# Patient Record
Sex: Male | Born: 1980 | Race: White | Hispanic: No | Smoking: Former smoker
Health system: Southern US, Community
[De-identification: ages and names within clinical notes are randomized; demographics above are authoritative.]

---

## 2008-06-13 ENCOUNTER — Encounter (INDEPENDENT_AMBULATORY_CARE_PROVIDER_SITE_OTHER): Payer: Self-pay | Admitting: *Deleted

## 2015-11-22 ENCOUNTER — Ambulatory Visit: Payer: BLUE CROSS/BLUE SHIELD | Admitting: Podiatry

## 2015-12-04 ENCOUNTER — Ambulatory Visit: Payer: BLUE CROSS/BLUE SHIELD | Admitting: Podiatry

## 2016-04-17 DIAGNOSIS — F988 Other specified behavioral and emotional disorders with onset usually occurring in childhood and adolescence: Secondary | ICD-10-CM | POA: Insufficient documentation

## 2016-04-17 DIAGNOSIS — K219 Gastro-esophageal reflux disease without esophagitis: Secondary | ICD-10-CM | POA: Insufficient documentation

## 2016-04-17 DIAGNOSIS — F419 Anxiety disorder, unspecified: Secondary | ICD-10-CM | POA: Insufficient documentation

## 2016-09-20 ENCOUNTER — Inpatient Hospital Stay (HOSPITAL_COMMUNITY)
Admission: EM | Admit: 2016-09-20 | Discharge: 2016-09-23 | DRG: 494 | Disposition: A | Discharge status: Home / Self Care | Payer: BLUE CROSS/BLUE SHIELD | Attending: Orthopaedic Surgery | Admitting: Orthopaedic Surgery

## 2016-09-20 ENCOUNTER — Encounter (HOSPITAL_COMMUNITY): Payer: Self-pay | Admitting: Emergency Medicine

## 2016-09-20 ENCOUNTER — Emergency Department (HOSPITAL_COMMUNITY): Payer: BLUE CROSS/BLUE SHIELD

## 2016-09-20 DIAGNOSIS — Z79899 Other long term (current) drug therapy: Secondary | ICD-10-CM

## 2016-09-20 DIAGNOSIS — Z419 Encounter for procedure for purposes other than remedying health state, unspecified: Secondary | ICD-10-CM

## 2016-09-20 DIAGNOSIS — S82231A Displaced oblique fracture of shaft of right tibia, initial encounter for closed fracture: Secondary | ICD-10-CM | POA: Diagnosis not present

## 2016-09-20 DIAGNOSIS — S82301A Unspecified fracture of lower end of right tibia, initial encounter for closed fracture: Secondary | ICD-10-CM

## 2016-09-20 DIAGNOSIS — M25571 Pain in right ankle and joints of right foot: Secondary | ICD-10-CM | POA: Diagnosis not present

## 2016-09-20 DIAGNOSIS — W000XXA Fall on same level due to ice and snow, initial encounter: Secondary | ICD-10-CM | POA: Diagnosis present

## 2016-09-20 DIAGNOSIS — S82209A Unspecified fracture of shaft of unspecified tibia, initial encounter for closed fracture: Secondary | ICD-10-CM | POA: Diagnosis present

## 2016-09-20 DIAGNOSIS — S82831A Other fracture of upper and lower end of right fibula, initial encounter for closed fracture: Secondary | ICD-10-CM | POA: Diagnosis present

## 2016-09-20 DIAGNOSIS — Z9889 Other specified postprocedural states: Secondary | ICD-10-CM

## 2016-09-20 DIAGNOSIS — Y92009 Unspecified place in unspecified non-institutional (private) residence as the place of occurrence of the external cause: Secondary | ICD-10-CM

## 2016-09-20 DIAGNOSIS — Z87891 Personal history of nicotine dependence: Secondary | ICD-10-CM

## 2016-09-20 DIAGNOSIS — S82391A Other fracture of lower end of right tibia, initial encounter for closed fracture: Secondary | ICD-10-CM

## 2016-09-20 DIAGNOSIS — Z7982 Long term (current) use of aspirin: Secondary | ICD-10-CM

## 2016-09-20 NOTE — ED Notes (Signed)
Bed: WA04 Expected date:  Expected time:  Means of arrival:  Comments: Fall, right ankle injury

## 2016-09-20 NOTE — ED Notes (Signed)
Patient transported to X-ray 

## 2016-09-20 NOTE — ED Triage Notes (Signed)
Per EMS pt from home slipped on ice and landed on right ankle. 10-12 beers on board. 100 m fentanly given en route.

## 2016-09-20 NOTE — ED Provider Notes (Signed)
[facility]-EMERGENCY DEPT Provider Note   CSN: [id number] Arrival date & time: 09/20/16  2214     History   Chief Complaint Chief Complaint  Patient presents with  . Ankle Injury    right    HPI Daniel Potter is a 36 y.o. male.  Pt presents to the ED today with right ankle pain.  He has been drinking alcohol and slipped on some ice.  EMS gave him 100 mcg fentanyl en route.      History reviewed. No pertinent past medical history.  Patient Active Problem List   Diagnosis Date Noted  . Closed tibia fracture 09/21/2016    History reviewed. No pertinent surgical history.     Home Medications    Prior to Admission medications   Not on File    Family History History reviewed. No pertinent family history.  Social History Social History  Substance Use Topics  . Smoking status: Former Smoker    Types: Cigarettes  . Smokeless tobacco: Never Used  . Alcohol use 7.2 oz/week    12 Cans of beer per week     Allergies   Patient has no known allergies.   Review of Systems Review of Systems  Musculoskeletal:       Right ankle  All other systems reviewed and are negative.    Physical Exam Updated Vital Signs BP 130/78   Pulse 100   Temp 98.6 F (37 C) (Oral)   Resp 24   Ht 6\' 5"  (1.956 m)   Wt 285 lb (129.3 kg)   SpO2 98%   BMI 33.80 kg/m   Physical Exam  Constitutional: He is oriented to person, place, and time. He appears well-developed and well-nourished.  HENT:  Head: Normocephalic and atraumatic.  Right Ear: External ear normal.  Left Ear: External ear normal.  Nose: Nose normal.  Mouth/Throat: Oropharynx is clear and moist.  Eyes: Conjunctivae and EOM are normal. Pupils are equal, round, and reactive to light.  Neck: Normal range of motion. Neck supple.  Cardiovascular: Normal rate, regular rhythm, normal heart sounds and intact distal pulses.   Pulmonary/Chest: Effort normal and breath sounds normal.  Abdominal: Soft. Bowel sounds are  normal.  Musculoskeletal:       Right ankle: He exhibits deformity. Tenderness. Achilles tendon exhibits no pain.  Neurological: He is alert and oriented to person, place, and time.  Skin: Skin is warm.  Psychiatric: He has a normal mood and affect. His behavior is normal. Judgment and thought content normal.  Nursing note and vitals reviewed.    ED Treatments / Results  Labs (all labs ordered are listed, but only abnormal results are displayed) Labs Reviewed - No data to display  EKG  EKG Interpretation None       Radiology Dg Tibia/fibula Right  Result Date: 09/20/2016 CLINICAL DATA:  Slipped on ice, pain EXAM: RIGHT TIBIA AND FIBULA - 2 VIEW COMPARISON:  None. FINDINGS: There is a proximal fibular fracture which demonstrates 1/2 bone with of posterior and lateral displacement of distal fracture fragment and approximately 6 mm of separation. No significant angulation. Additional fracture of the distal shaft of the tibia, demonstrating close to 1/2 shaft diameter of lateral displacement. IMPRESSION: 1. Displaced proximal fibular fracture 2. Displaced distal tibial shaft fracture Electronically Signed   By: Jasmine Pang M.D.   On: 09/20/2016 23:14   Dg Ankle Complete Right  Result Date: 09/20/2016 CLINICAL DATA:  Slipped on ice, pain to the tib-fib and ankle EXAM: RIGHT ANKLE -  COMPLETE 3+ VIEW COMPARISON:  None FINDINGS: Small plantar calcaneal spur. Ankle mortise is symmetric. Partially visualized fracture of the distal diaphysis of the tibia, this demonstrates 1/3 shaft diameter of lateral displacement of distal fracture fragment. IMPRESSION: Partially visualized displaced fracture of the distal shaft of the tibia. Electronically Signed   By: Jasmine PangKim  Fujinaga M.D.   On: 09/20/2016 23:13    Procedures Procedures (including critical care time)  Medications Ordered in ED Medications  morphine 4 MG/ML injection 4 mg (not administered)  ondansetron (ZOFRAN) injection 4 mg (not  administered)  famotidine (PEPCID) IVPB 20 mg premix (not administered)  LORazepam (ATIVAN) injection 1 mg (not administered)     Initial Impression / Assessment and Plan / ED Course  I have reviewed the triage vital signs and the nursing notes.  Pertinent labs & imaging results that were available during my care of the patient were reviewed by me and considered in my medical decision making (see chart for details).    Pt d/w Dr. Roda ShuttersXu [facility]    Final Clinical Impressions(s) / ED Diagnoses   Final diagnoses:  Other closed fracture of distal end of right tibia, initial encounter  Other closed fracture of proximal end of right fibula, initial encounter    New Prescriptions New Prescriptions   No medications on file     Jacalyn LefevreJulie Oland Arquette, MD 09/21/16 [phone]

## 2016-09-21 ENCOUNTER — Inpatient Hospital Stay (HOSPITAL_COMMUNITY): Payer: BLUE CROSS/BLUE SHIELD

## 2016-09-21 ENCOUNTER — Encounter (HOSPITAL_COMMUNITY)
Admission: EM | Disposition: A | Discharge status: Home / Self Care | Payer: Self-pay | Source: Home / Self Care | Attending: Orthopaedic Surgery

## 2016-09-21 ENCOUNTER — Inpatient Hospital Stay (HOSPITAL_COMMUNITY): Payer: BLUE CROSS/BLUE SHIELD | Admitting: Certified Registered"

## 2016-09-21 DIAGNOSIS — M25571 Pain in right ankle and joints of right foot: Secondary | ICD-10-CM | POA: Diagnosis present

## 2016-09-21 DIAGNOSIS — S82831A Other fracture of upper and lower end of right fibula, initial encounter for closed fracture: Secondary | ICD-10-CM | POA: Diagnosis present

## 2016-09-21 DIAGNOSIS — S82391A Other fracture of lower end of right tibia, initial encounter for closed fracture: Secondary | ICD-10-CM | POA: Diagnosis not present

## 2016-09-21 DIAGNOSIS — W000XXA Fall on same level due to ice and snow, initial encounter: Secondary | ICD-10-CM | POA: Diagnosis present

## 2016-09-21 DIAGNOSIS — Z79899 Other long term (current) drug therapy: Secondary | ICD-10-CM | POA: Diagnosis not present

## 2016-09-21 DIAGNOSIS — Z7982 Long term (current) use of aspirin: Secondary | ICD-10-CM | POA: Diagnosis not present

## 2016-09-21 DIAGNOSIS — S82231A Displaced oblique fracture of shaft of right tibia, initial encounter for closed fracture: Secondary | ICD-10-CM | POA: Diagnosis not present

## 2016-09-21 DIAGNOSIS — S82209A Unspecified fracture of shaft of unspecified tibia, initial encounter for closed fracture: Secondary | ICD-10-CM | POA: Diagnosis present

## 2016-09-21 DIAGNOSIS — Y92009 Unspecified place in unspecified non-institutional (private) residence as the place of occurrence of the external cause: Secondary | ICD-10-CM | POA: Diagnosis not present

## 2016-09-21 DIAGNOSIS — S82301A Unspecified fracture of lower end of right tibia, initial encounter for closed fracture: Secondary | ICD-10-CM

## 2016-09-21 DIAGNOSIS — Z87891 Personal history of nicotine dependence: Secondary | ICD-10-CM | POA: Diagnosis not present

## 2016-09-21 HISTORY — PX: TIBIA IM NAIL INSERTION: SHX2516

## 2016-09-21 SURGERY — INSERTION, INTRAMEDULLARY ROD, TIBIA
Anesthesia: General | Site: Leg Lower | Laterality: Right

## 2016-09-21 MED ORDER — SUGAMMADEX SODIUM 500 MG/5ML IV SOLN
INTRAVENOUS | Status: AC
  Filled 2016-09-21: qty 5

## 2016-09-21 MED ORDER — PROPOFOL 10 MG/ML IV BOLUS
INTRAVENOUS | Status: AC
  Filled 2016-09-21: qty 20

## 2016-09-21 MED ORDER — MORPHINE SULFATE (PF) 2 MG/ML IV SOLN
2.0000 mg | INTRAVENOUS | Status: DC | PRN
  Administered 2016-09-21: 2 mg via INTRAVENOUS
  Filled 2016-09-21: qty 1

## 2016-09-21 MED ORDER — ONDANSETRON HCL 4 MG/2ML IJ SOLN
INTRAMUSCULAR | Status: AC
  Filled 2016-09-21: qty 2

## 2016-09-21 MED ORDER — ONDANSETRON HCL 4 MG PO TABS: 4.0000 mg | ORAL_TABLET | Freq: Three times a day (TID) | ORAL | 0 refills | Status: DC | PRN

## 2016-09-21 MED ORDER — PROPOFOL 10 MG/ML IV BOLUS
INTRAVENOUS | Status: DC | PRN
  Administered 2016-09-21: 130 mg via INTRAVENOUS

## 2016-09-21 MED ORDER — SODIUM CHLORIDE 0.9 % IV SOLN: INTRAVENOUS | Status: DC

## 2016-09-21 MED ORDER — FENTANYL CITRATE (PF) 100 MCG/2ML IJ SOLN
INTRAMUSCULAR | Status: DC | PRN
  Administered 2016-09-21 (×7): 50 ug via INTRAVENOUS

## 2016-09-21 MED ORDER — POLYETHYLENE GLYCOL 3350 17 G PO PACK: 17.0000 g | PACK | Freq: Every day | ORAL | Status: DC | PRN

## 2016-09-21 MED ORDER — HYDROMORPHONE HCL 1 MG/ML IJ SOLN
1.0000 mg | INTRAMUSCULAR | Status: DC | PRN
  Filled 2016-09-21: qty 1

## 2016-09-21 MED ORDER — DEXTROSE 5 % IV SOLN
500.0000 mg | Freq: Four times a day (QID) | INTRAVENOUS | Status: DC | PRN
  Filled 2016-09-21: qty 5

## 2016-09-21 MED ORDER — METHOCARBAMOL 500 MG PO TABS: 500.0000 mg | ORAL_TABLET | Freq: Four times a day (QID) | ORAL | Status: DC | PRN

## 2016-09-21 MED ORDER — OXYCODONE HCL 5 MG PO TABS: 5.0000 mg | ORAL_TABLET | ORAL | 0 refills | Status: DC | PRN

## 2016-09-21 MED ORDER — SUCCINYLCHOLINE CHLORIDE 20 MG/ML IJ SOLN
INTRAMUSCULAR | Status: DC | PRN
  Administered 2016-09-21: 160 mg via INTRAVENOUS

## 2016-09-21 MED ORDER — CEFAZOLIN SODIUM 1 G IJ SOLR
INTRAMUSCULAR | Status: DC | PRN
  Administered 2016-09-21: 3 g via INTRAMUSCULAR

## 2016-09-21 MED ORDER — SUCCINYLCHOLINE CHLORIDE 200 MG/10ML IV SOSY
PREFILLED_SYRINGE | INTRAVENOUS | Status: AC
  Filled 2016-09-21: qty 10

## 2016-09-21 MED ORDER — ARTIFICIAL TEARS OP OINT
TOPICAL_OINTMENT | OPHTHALMIC | Status: AC
  Filled 2016-09-21: qty 3.5

## 2016-09-21 MED ORDER — LORAZEPAM 2 MG/ML IJ SOLN: 0.5000 mg | Freq: Once | INTRAMUSCULAR | Status: DC

## 2016-09-21 MED ORDER — HYDROMORPHONE HCL 1 MG/ML IJ SOLN
INTRAMUSCULAR | Status: AC
  Filled 2016-09-21: qty 0.5

## 2016-09-21 MED ORDER — DEXMEDETOMIDINE HCL 200 MCG/2ML IV SOLN
INTRAVENOUS | Status: DC | PRN
  Administered 2016-09-21 (×3): 16 ug via INTRAVENOUS
  Administered 2016-09-21: 8 ug via INTRAVENOUS
  Administered 2016-09-21: 16 ug via INTRAVENOUS
  Administered 2016-09-21: 8 ug via INTRAVENOUS

## 2016-09-21 MED ORDER — LIDOCAINE 2% (20 MG/ML) 5 ML SYRINGE
INTRAMUSCULAR | Status: AC
  Filled 2016-09-21: qty 5

## 2016-09-21 MED ORDER — SUGAMMADEX SODIUM 200 MG/2ML IV SOLN
INTRAVENOUS | Status: DC | PRN
  Administered 2016-09-21: 260 mg via INTRAVENOUS

## 2016-09-21 MED ORDER — ASPIRIN EC 325 MG PO TBEC
325.0000 mg | DELAYED_RELEASE_TABLET | Freq: Two times a day (BID) | ORAL | Status: DC
  Administered 2016-09-21 – 2016-09-23 (×4): 325 mg via ORAL
  Filled 2016-09-21 (×4): qty 1

## 2016-09-21 MED ORDER — LORAZEPAM 2 MG/ML IJ SOLN
0.5000 mg | INTRAMUSCULAR | Status: DC | PRN
  Administered 2016-09-23: 0.5 mg via INTRAVENOUS
  Filled 2016-09-21: qty 1

## 2016-09-21 MED ORDER — ONDANSETRON HCL 4 MG/2ML IJ SOLN
4.0000 mg | Freq: Once | INTRAMUSCULAR | Status: AC
  Administered 2016-09-21: 4 mg via INTRAVENOUS
  Filled 2016-09-21: qty 2

## 2016-09-21 MED ORDER — CEFAZOLIN SODIUM-DEXTROSE 2-4 GM/100ML-% IV SOLN
2.0000 g | Freq: Four times a day (QID) | INTRAVENOUS | Status: AC
  Administered 2016-09-21 – 2016-09-22 (×3): 2 g via INTRAVENOUS
  Filled 2016-09-21 (×3): qty 100

## 2016-09-21 MED ORDER — MIDAZOLAM HCL 2 MG/2ML IJ SOLN
INTRAMUSCULAR | Status: AC
  Filled 2016-09-21: qty 2

## 2016-09-21 MED ORDER — ONDANSETRON HCL 4 MG/2ML IJ SOLN: 4.0000 mg | Freq: Once | INTRAMUSCULAR | Status: DC | PRN

## 2016-09-21 MED ORDER — VARENICLINE TARTRATE 1 MG PO TABS
1.0000 mg | ORAL_TABLET | Freq: Two times a day (BID) | ORAL | Status: DC
  Administered 2016-09-21: 1 mg via ORAL
  Filled 2016-09-21 (×6): qty 1

## 2016-09-21 MED ORDER — OXYCODONE HCL 5 MG PO TABS
5.0000 mg | ORAL_TABLET | ORAL | Status: DC | PRN
  Administered 2016-09-21 – 2016-09-23 (×7): 15 mg via ORAL
  Filled 2016-09-21 (×7): qty 3

## 2016-09-21 MED ORDER — OXYCODONE HCL 5 MG PO TABS
5.0000 mg | ORAL_TABLET | ORAL | Status: DC | PRN
  Administered 2016-09-21: 10 mg via ORAL
  Filled 2016-09-21: qty 2

## 2016-09-21 MED ORDER — SODIUM CHLORIDE 0.9 % IV SOLN
INTRAVENOUS | Status: AC
  Administered 2016-09-21: 125 mL/h via INTRAVENOUS

## 2016-09-21 MED ORDER — ONDANSETRON HCL 4 MG/2ML IJ SOLN
4.0000 mg | Freq: Three times a day (TID) | INTRAMUSCULAR | Status: AC | PRN
  Filled 2016-09-21: qty 2

## 2016-09-21 MED ORDER — SORBITOL 70 % SOLN: 30.0000 mL | Freq: Every day | Status: DC | PRN

## 2016-09-21 MED ORDER — CITALOPRAM HYDROBROMIDE 40 MG PO TABS
40.0000 mg | ORAL_TABLET | Freq: Every day | ORAL | Status: DC
  Administered 2016-09-21 – 2016-09-23 (×3): 40 mg via ORAL
  Filled 2016-09-21 (×3): qty 1

## 2016-09-21 MED ORDER — ALBUTEROL SULFATE HFA 108 (90 BASE) MCG/ACT IN AERS
INHALATION_SPRAY | RESPIRATORY_TRACT | Status: DC | PRN
  Administered 2016-09-21 (×3): 2 via RESPIRATORY_TRACT

## 2016-09-21 MED ORDER — MIDAZOLAM HCL 5 MG/5ML IJ SOLN
INTRAMUSCULAR | Status: DC | PRN
  Administered 2016-09-21: 2 mg via INTRAVENOUS

## 2016-09-21 MED ORDER — OXYCODONE HCL ER 10 MG PO T12A: 10.0000 mg | EXTENDED_RELEASE_TABLET | Freq: Two times a day (BID) | ORAL | 0 refills | Status: DC

## 2016-09-21 MED ORDER — LORAZEPAM 2 MG/ML IJ SOLN
0.5000 mg | Freq: Once | INTRAMUSCULAR | Status: DC
  Filled 2016-09-21: qty 1

## 2016-09-21 MED ORDER — METOCLOPRAMIDE HCL 5 MG/ML IJ SOLN: 5.0000 mg | Freq: Three times a day (TID) | INTRAMUSCULAR | Status: DC | PRN

## 2016-09-21 MED ORDER — HYDROMORPHONE HCL 2 MG/ML IJ SOLN
1.0000 mg | INTRAMUSCULAR | Status: DC | PRN
  Administered 2016-09-21 – 2016-09-23 (×8): 1 mg via INTRAVENOUS
  Filled 2016-09-21 (×8): qty 1

## 2016-09-21 MED ORDER — ONDANSETRON HCL 4 MG PO TABS: 4.0000 mg | ORAL_TABLET | Freq: Four times a day (QID) | ORAL | Status: DC | PRN

## 2016-09-21 MED ORDER — ASPIRIN EC 325 MG PO TBEC
325.0000 mg | DELAYED_RELEASE_TABLET | Freq: Two times a day (BID) | ORAL | 0 refills | Status: DC
Start: 2016-09-21 — End: 2020-01-31

## 2016-09-21 MED ORDER — LORAZEPAM 2 MG/ML IJ SOLN
1.0000 mg | Freq: Once | INTRAMUSCULAR | Status: AC
  Administered 2016-09-21: 1 mg via INTRAVENOUS
  Filled 2016-09-21: qty 1

## 2016-09-21 MED ORDER — KETOROLAC TROMETHAMINE 30 MG/ML IJ SOLN
30.0000 mg | Freq: Four times a day (QID) | INTRAMUSCULAR | Status: DC
  Administered 2016-09-21 – 2016-09-23 (×7): 30 mg via INTRAVENOUS
  Filled 2016-09-21 (×8): qty 1

## 2016-09-21 MED ORDER — SENNOSIDES-DOCUSATE SODIUM 8.6-50 MG PO TABS: 1.0000 | ORAL_TABLET | Freq: Every evening | ORAL | 1 refills | Status: DC | PRN

## 2016-09-21 MED ORDER — MORPHINE SULFATE (PF) 4 MG/ML IV SOLN
4.0000 mg | Freq: Once | INTRAVENOUS | Status: AC
  Administered 2016-09-21: 4 mg via INTRAVENOUS
  Filled 2016-09-21: qty 1

## 2016-09-21 MED ORDER — MEPERIDINE HCL 25 MG/ML IJ SOLN: 6.2500 mg | INTRAMUSCULAR | Status: DC | PRN

## 2016-09-21 MED ORDER — HYDROMORPHONE HCL 1 MG/ML IJ SOLN
0.2500 mg | INTRAMUSCULAR | Status: DC | PRN
  Administered 2016-09-21: 0.5 mg via INTRAVENOUS

## 2016-09-21 MED ORDER — OXYCODONE HCL ER 10 MG PO T12A
10.0000 mg | EXTENDED_RELEASE_TABLET | Freq: Two times a day (BID) | ORAL | Status: DC
  Administered 2016-09-21 – 2016-09-23 (×4): 10 mg via ORAL
  Filled 2016-09-21 (×4): qty 1

## 2016-09-21 MED ORDER — MAGNESIUM CITRATE PO SOLN: 1.0000 | Freq: Once | ORAL | Status: DC | PRN

## 2016-09-21 MED ORDER — 0.9 % SODIUM CHLORIDE (POUR BTL) OPTIME
TOPICAL | Status: DC | PRN
  Administered 2016-09-21: 1000 mL

## 2016-09-21 MED ORDER — ACETAMINOPHEN 650 MG RE SUPP: 650.0000 mg | Freq: Four times a day (QID) | RECTAL | Status: DC | PRN

## 2016-09-21 MED ORDER — CALCIUM CARBONATE ANTACID 500 MG PO CHEW
1.0000 | CHEWABLE_TABLET | Freq: Two times a day (BID) | ORAL | Status: DC | PRN
  Administered 2016-09-22: 200 mg via ORAL
  Filled 2016-09-21: qty 1

## 2016-09-21 MED ORDER — FENTANYL CITRATE (PF) 100 MCG/2ML IJ SOLN
INTRAMUSCULAR | Status: AC
  Filled 2016-09-21: qty 2

## 2016-09-21 MED ORDER — ONDANSETRON HCL 4 MG/2ML IJ SOLN: 4.0000 mg | Freq: Four times a day (QID) | INTRAMUSCULAR | Status: DC | PRN

## 2016-09-21 MED ORDER — METOCLOPRAMIDE HCL 5 MG PO TABS
5.0000 mg | ORAL_TABLET | Freq: Three times a day (TID) | ORAL | Status: DC | PRN
  Administered 2016-09-23: 5 mg via ORAL
  Filled 2016-09-21: qty 1

## 2016-09-21 MED ORDER — FENTANYL CITRATE (PF) 100 MCG/2ML IJ SOLN
INTRAMUSCULAR | Status: AC
  Filled 2016-09-21: qty 4

## 2016-09-21 MED ORDER — ROCURONIUM BROMIDE 50 MG/5ML IV SOSY
PREFILLED_SYRINGE | INTRAVENOUS | Status: AC
  Filled 2016-09-21: qty 5

## 2016-09-21 MED ORDER — METHOCARBAMOL 750 MG PO TABS: 750.0000 mg | ORAL_TABLET | Freq: Two times a day (BID) | ORAL | 0 refills | Status: DC | PRN

## 2016-09-21 MED ORDER — ACETAMINOPHEN 325 MG PO TABS: 650.0000 mg | ORAL_TABLET | Freq: Four times a day (QID) | ORAL | Status: DC | PRN

## 2016-09-21 MED ORDER — METHOCARBAMOL 500 MG PO TABS
500.0000 mg | ORAL_TABLET | Freq: Four times a day (QID) | ORAL | Status: DC | PRN
  Administered 2016-09-21 – 2016-09-23 (×3): 500 mg via ORAL
  Filled 2016-09-21 (×3): qty 1

## 2016-09-21 MED ORDER — MORPHINE SULFATE (PF) 2 MG/ML IV SOLN: 1.0000 mg | INTRAVENOUS | Status: DC | PRN

## 2016-09-21 MED ORDER — METHOCARBAMOL 1000 MG/10ML IJ SOLN
500.0000 mg | Freq: Four times a day (QID) | INTRAVENOUS | Status: DC | PRN
  Filled 2016-09-21: qty 5

## 2016-09-21 MED ORDER — ROCURONIUM BROMIDE 100 MG/10ML IV SOLN
INTRAVENOUS | Status: DC | PRN
  Administered 2016-09-21: 50 mg via INTRAVENOUS

## 2016-09-21 MED ORDER — FAMOTIDINE IN NACL 20-0.9 MG/50ML-% IV SOLN
20.0000 mg | Freq: Once | INTRAVENOUS | Status: AC
  Administered 2016-09-21: 20 mg via INTRAVENOUS
  Filled 2016-09-21: qty 50

## 2016-09-21 MED ORDER — LACTATED RINGERS IV SOLN
INTRAVENOUS | Status: DC
  Administered 2016-09-21 (×2): via INTRAVENOUS

## 2016-09-21 MED ORDER — PROMETHAZINE HCL 25 MG PO TABS: 25.0000 mg | ORAL_TABLET | Freq: Four times a day (QID) | ORAL | 1 refills | Status: DC | PRN

## 2016-09-21 MED ORDER — LIDOCAINE HCL (CARDIAC) 20 MG/ML IV SOLN
INTRAVENOUS | Status: DC | PRN
  Administered 2016-09-21: 100 mg via INTRAVENOUS

## 2016-09-21 MED ORDER — DIPHENHYDRAMINE HCL 12.5 MG/5ML PO ELIX
25.0000 mg | ORAL_SOLUTION | ORAL | Status: DC | PRN
  Administered 2016-09-22 – 2016-09-23 (×6): 25 mg via ORAL
  Filled 2016-09-21 (×6): qty 10

## 2016-09-21 SURGICAL SUPPLY — 61 items
BANDAGE ACE 3X5.8 VEL STRL LF (GAUZE/BANDAGES/DRESSINGS) ×3 IMPLANT
BANDAGE ACE 4X5 VEL STRL LF (GAUZE/BANDAGES/DRESSINGS) ×3 IMPLANT
BANDAGE ACE 6X5 VEL STRL LF (GAUZE/BANDAGES/DRESSINGS) ×3 IMPLANT
BANDAGE ESMARK 6X9 LF (GAUZE/BANDAGES/DRESSINGS) ×1 IMPLANT
BIT DRILL AO GAMMA 4.2X130 (BIT) ×6 IMPLANT
BIT DRILL AO GAMMA 4.2X180 (BIT) ×6 IMPLANT
BIT DRILL AO GAMMA 4.2X340 (BIT) ×3 IMPLANT
BNDG ESMARK 6X9 LF (GAUZE/BANDAGES/DRESSINGS) ×3
COVER MAYO STAND STRL (DRAPES) ×3 IMPLANT
COVER SURGICAL LIGHT HANDLE (MISCELLANEOUS) ×3 IMPLANT
CUFF TOURNIQUET SINGLE 34IN LL (TOURNIQUET CUFF) ×3 IMPLANT
DRAPE C-ARM 42X72 X-RAY (DRAPES) ×3 IMPLANT
DRAPE C-ARMOR (DRAPES) ×3 IMPLANT
DRAPE HALF SHEET 40X57 (DRAPES) ×6 IMPLANT
DRAPE IMP U-DRAPE 54X76 (DRAPES) ×3 IMPLANT
DRAPE POUCH INSTRU U-SHP 10X18 (DRAPES) ×3 IMPLANT
DRAPE U-SHAPE 47X51 STRL (DRAPES) ×3 IMPLANT
DRAPE UTILITY XL STRL (DRAPES) ×6 IMPLANT
DURAPREP 26ML APPLICATOR (WOUND CARE) ×3 IMPLANT
ELECT CAUTERY BLADE 6.4 (BLADE) ×3 IMPLANT
ELECT REM PT RETURN 9FT ADLT (ELECTROSURGICAL) ×3
ELECTRODE REM PT RTRN 9FT ADLT (ELECTROSURGICAL) ×1 IMPLANT
FACESHIELD WRAPAROUND (MASK) IMPLANT
GAUZE SPONGE 4X4 12PLY STRL (GAUZE/BANDAGES/DRESSINGS) ×3 IMPLANT
GAUZE XEROFORM 1X8 LF (GAUZE/BANDAGES/DRESSINGS) ×3 IMPLANT
GLOVE SKINSENSE NS SZ7.5 (GLOVE) ×8
GLOVE SKINSENSE STRL SZ7.5 (GLOVE) ×4 IMPLANT
GOWN STRL REIN XL XLG (GOWN DISPOSABLE) ×3 IMPLANT
GUIDEROD T2 3X1000 (ROD) ×3 IMPLANT
GUIDEWIRE GAMMA (WIRE) ×6 IMPLANT
K-WIRE FIXATION 3X285 COATED (WIRE) ×6
KIT BASIN OR (CUSTOM PROCEDURE TRAY) ×3 IMPLANT
KWIRE FIXATION 3X285 COATED (WIRE) ×2 IMPLANT
MANIFOLD NEPTUNE II (INSTRUMENTS) ×3 IMPLANT
NAIL TIBIAL STND 11X405MM (Nail) ×2 IMPLANT
NAIL TIBIALSTD 11X405MM (Nail) ×1 IMPLANT
NS IRRIG 1000ML POUR BTL (IV SOLUTION) ×3 IMPLANT
PACK TOTAL JOINT (CUSTOM PROCEDURE TRAY) ×3 IMPLANT
PACK UNIVERSAL I (CUSTOM PROCEDURE TRAY) ×3 IMPLANT
PAD CAST 4YDX4 CTTN HI CHSV (CAST SUPPLIES) ×2 IMPLANT
PADDING CAST ABS 3INX4YD NS (CAST SUPPLIES) ×2
PADDING CAST ABS 6INX4YD NS (CAST SUPPLIES) ×2
PADDING CAST ABS COTTON 3X4 (CAST SUPPLIES) ×1 IMPLANT
PADDING CAST ABS COTTON 6X4 NS (CAST SUPPLIES) ×1 IMPLANT
PADDING CAST COTTON 4X4 STRL (CAST SUPPLIES) ×4
REAMER INTRAMEDULLARY 8MM 510 (MISCELLANEOUS) ×2 IMPLANT
SCREW LOCK FULL THREAD 05X57.5 (Screw) ×3 IMPLANT
SCREW LOCKING T2 F/T  5MMX70MM (Screw) ×2 IMPLANT
SCREW LOCKING T2 F/T 5MMX70MM (Screw) ×1 IMPLANT
SHAFT REAMER  8X885MM (MISCELLANEOUS) ×2
SHAFT REAMER 8X885MM (MISCELLANEOUS) ×1 IMPLANT
SLEEVE NAIL INSERT ×6 IMPLANT
STAPLER SKIN PROX WIDE 3.9 (STAPLE) ×3 IMPLANT
STAPLER VISISTAT 35W (STAPLE) ×3 IMPLANT
SUT VIC AB 0 CT1 27 (SUTURE) ×6
SUT VIC AB 0 CT1 27XBRD ANBCTR (SUTURE) ×1 IMPLANT
SUT VIC AB 0 CT1 27XBRD ANTBC (SUTURE) ×2 IMPLANT
SUT VIC AB 2-0 CT1 27 (SUTURE) ×6
SUT VIC AB 2-0 CT1 TAPERPNT 27 (SUTURE) ×3 IMPLANT
TOWEL OR 17X26 10 PK STRL BLUE (TOWEL DISPOSABLE) ×6 IMPLANT
WATER STERILE IRR 1000ML POUR (IV SOLUTION) ×3 IMPLANT

## 2016-09-21 NOTE — Anesthesia Procedure Notes (Signed)
Procedure Name: Intubation Date/Time: 09/21/2016 12:56 PM Performed by: Rosiland OzMEYERS, Townsend Cudworth Pre-anesthesia Checklist: Patient identified, Emergency Drugs available, Suction available, Patient being monitored and Timeout performed Patient Re-evaluated:Patient Re-evaluated prior to inductionOxygen Delivery Method: Circle system utilized Preoxygenation: Pre-oxygenation with 100% oxygen Intubation Type: IV induction, Rapid sequence and Cricoid Pressure applied Laryngoscope Size: Miller and 3 Grade View: Grade I Tube size: 7.5 mm Number of attempts: 1 Airway Equipment and Method: Stylet Placement Confirmation: ETT inserted through vocal cords under direct vision,  positive ETCO2 and breath sounds checked- equal and bilateral Secured at: 22 cm Tube secured with: Tape Dental Injury: Teeth and Oropharynx as per pre-operative assessment

## 2016-09-21 NOTE — Op Note (Signed)
Date of Surgery: 09/21/2016  INDICATIONS: Mr. Harcum is a 36 y.o.-year-old male who was involved in a ground level fall on ice and sustained a right tibia fracture. The risks and benefits of the procedure discussed with the patient prior to the procedure and all questions were answered; consent was obtained.  PREOPERATIVE DIAGNOSIS:  right tibia fracture  POSTOPERATIVE DIAGNOSIS: Same  PROCEDURE:   1. right tibia closed reduction and intramedullary nailing CPT: 27759  2. closed treatment of left fibular shaft fracture with manipulation, CPT - 16109  SURGEON: N. Glee Arvin, M.D.  ASSISTANT: Doneen Poisson, MD necessary for timely completion of surgery .  ANESTHESIA:  general  IV FLUIDS AND URINE: See anesthesia record.  ESTIMATED BLOOD LOSS: minimal mL.  IMPLANTS: Stryker 11 x 405   DRAINS: None.  COMPLICATIONS: None.  DESCRIPTION OF PROCEDURE: The patient was brought to the operating room and placed supine on the operating table.  The patient's leg had been signed prior to the procedure.  The patient had the anesthesia placed by the anesthesiologist.  The prep verification and incision time-outs were performed to confirm that this was the correct patient, site, side and location. The patient had an SCD on the opposite lower extremity. The patient did receive antibiotics prior to the incision and was re-dosed during the procedure as needed at indicated intervals.  The patient had the lower extremity prepped and draped in the standard surgical fashion.  The incision was first made over the quadriceps tendon in the midline and taken down to the skin and subcutaneous tissue to expose the peritenon. The peritenon was incised in line with the skin incision and then a poke hole was made in the quadriceps tendon in the midline. A knife was then used to longitudinally divide the tendon in line with its fibers, taking care not to cross over any fibers. The guide wire was placed at the  proximal, anterior tibia, confirming its location on both AP and lateral views. The wire was drilled into the bone and then the opening reamer was placed over this and maneuvered so that the reamer was parallel with anterior cortex of the tibia. The ball-tipped guide wire was then placed down into the canal towards the fracture site. The fracture was reduced and the wire was passed and confirmed to be in the proper location on both AP and lateral views.  The measuring stick was used to measure the length of the nail.  Sequential reaming was then performed, then the nail was gently hammered into place over the guide wire and the guide wire was removed. The proximal screws were placed through the interlocking drill guide using the sleeve. The distal screws were placed using the perfect circles technique. All screws were placed in the standard fashion, first incising the skin and then spreading with a tonsil, then drilling, measuring with a depth gauge, and then placing the screws by hand. The final x-rays were taken in both AP and lateral views to confirm the fracture reduction as well as the placement of all hardware. The fibula fracture was treated in a closed manner.  The wounds were copiously irrigated with saline and then the peritenon was closed with 0 Vicryl figure-of-eight interrupted sutures. 2.0 vicryl was used to close the subcutaneous layer.  Staples were then used to close all of the open incision wounds.  The wounds were cleaned and dried a final time and a sterile dressing was placed. The patient was then placed in a short leg  splint in neutral ankle dorsiflexion. The patient's calf was soft to palpation at the end of the case.  The patient was then transferred to a bed and taken to the recovery room in stable condition.  All counts were correct at the end of the case.  POSTOPERATIVE PLAN: Daniel Potter will be NWB and will return 2 weeks for suture removal.  Daniel Potter will receive DVT  prophylaxis.  Mayra ReelN. Michael Shantale Holtmeyer, MD [facility] [phone] 2:15 PM

## 2016-09-21 NOTE — Anesthesia Postprocedure Evaluation (Signed)
Anesthesia Post Note  Patient: Daniel Potter  Procedure(s) Performed: Procedure(s) (LRB): INTRAMEDULLARY (IM) NAIL TIBIAL (Right)  Patient location during evaluation: PACU Anesthesia Type: General Level of consciousness: awake and alert Pain management: pain level controlled Vital Signs Assessment: post-procedure vital signs reviewed and stable Respiratory status: spontaneous breathing, nonlabored ventilation, respiratory function stable and patient connected to nasal cannula oxygen Cardiovascular status: blood pressure returned to baseline and stable Postop Assessment: no signs of nausea or vomiting Anesthetic complications: no       Last Vitals:  Vitals:   09/21/16 1529 09/21/16 1601  BP: 132/77 133/85  Pulse: 89 90  Resp: 16 19  Temp: 37.2 C 37.1 C    Last Pain:  Vitals:   09/21/16 1753  TempSrc:   PainSc: 9                  Sovereign Ramiro DAVID

## 2016-09-21 NOTE — Discharge Instructions (Signed)
    1. Change dressings as needed 2. May shower but keep incisions covered and dry 3. Take aspirin to prevent blood clots 4. Take stool softeners as needed 5. Take pain meds as needed  

## 2016-09-21 NOTE — Plan of Care (Signed)
Problem: Safety: Goal: Ability to remain free from injury will improve Outcome: Progressing Safety precautions maintained, wife at bedside  Problem: Pain Managment: Goal: General experience of comfort will improve Outcome: Progressing Medicated once for pain with full relief, resting in the bed with eyes closed  Problem: Tissue Perfusion: Goal: Risk factors for ineffective tissue perfusion will decrease Outcome: Progressing No S/S of DVT noted  Problem: Activity: Goal: Risk for activity intolerance will decrease Outcome: Not Progressing Painful with movement in the bed  Problem: Nutrition: Goal: Adequate nutrition will be maintained Outcome: Progressing Reported good appetite  Problem: Bowel/Gastric: Goal: Will not experience complications related to bowel motility Outcome: Progressing No issues reported

## 2016-09-21 NOTE — Transfer of Care (Signed)
Immediate Anesthesia Transfer of Care Note  Patient: Daniel Potter  Procedure(s) Performed: Procedure(s): INTRAMEDULLARY (IM) NAIL TIBIAL (Right)  Patient Location: PACU  Anesthesia Type:General  Level of Consciousness: awake and patient cooperative  Airway & Oxygen Therapy: Patient Spontanous Breathing and Patient connected to face mask oxygen  Post-op Assessment: Report given to RN and Post -op Vital signs reviewed and stable  Post vital signs: Reviewed and stable  Last Vitals:  Vitals:   09/21/16 0430 09/21/16 1430  BP: (!) 141/88   Pulse: 96   Resp: 20   Temp: 37.8 C 36.2 C    Last Pain:  Vitals:   09/21/16 1112  TempSrc:   PainSc: 10-Worst pain ever      Patients Stated Pain Goal: 0 (09/21/16 0810)  Complications: No apparent anesthesia complications

## 2016-09-21 NOTE — ED Notes (Signed)
Patient taken to [place] by [facility] 

## 2016-09-21 NOTE — Anesthesia Preprocedure Evaluation (Signed)
Anesthesia Evaluation  Patient identified by MRN, date of birth, ID band Patient awake    Reviewed: Allergy & Precautions, NPO status , Patient's Chart, lab work & pertinent test results  Airway Mallampati: I  TM Distance: >3 FB Neck ROM: Full    Dental   Pulmonary former smoker,    Pulmonary exam normal        Cardiovascular Normal cardiovascular exam     Neuro/Psych    GI/Hepatic   Endo/Other    Renal/GU      Musculoskeletal   Abdominal   Peds  Hematology   Anesthesia Other Findings   Reproductive/Obstetrics                             Anesthesia Physical Anesthesia Plan  ASA: II and emergent  Anesthesia Plan: General   Post-op Pain Management:    Induction: Intravenous, Rapid sequence and Cricoid pressure planned  Airway Management Planned: Oral ETT  Additional Equipment:   Intra-op Plan:   Post-operative Plan: Extubation in OR  Informed Consent: I have reviewed the patients History and Physical, chart, labs and discussed the procedure including the risks, benefits and alternatives for the proposed anesthesia with the patient or authorized representative who has indicated his/her understanding and acceptance.     Plan Discussed with: CRNA and Surgeon  Anesthesia Plan Comments:         Anesthesia Quick Evaluation  

## 2016-09-21 NOTE — Progress Notes (Signed)
Patient is admitted in room 5N24, alert, oriented x4, was very talkative but aggressive when came in, familt at bedside, no acute distress noted, right lower leg with splint and ACE wrap, small abrasion noted to between 4th and pinky finger, no other skin issues noted, was medicated with Dilaudid 1 mg IVP and slept immediately, scheduled Ativan not given, patient continue to sleep at present moment, will continue to monitor.

## 2016-09-22 NOTE — H&P (Signed)
ORTHOPAEDIC HISTORY AND PHYSICAL   Chief Complaint: Right tib-fib fracture  HPI: Daniel Potter is a 36 y.o. male who complains of right tib-fib fracture status post mechanical fall prior to arrival in the ER. Denies any loss of consciousness, neck pain, abdominal pain. Pain is severe in the right lower extremity that does not radiate and worse with ambulation. Pain does not radiate.  Past medical history is negative for diabetes Social History   Social History  . Marital status: Unknown    Spouse name: N/A  . Number of children: N/A  . Years of education: N/A   Social History Main Topics  . Smoking status: Former Smoker    Types: Cigarettes  . Smokeless tobacco: Never Used  . Alcohol use 7.2 oz/week    12 Cans of beer per week  . Drug use: No  . Sexual activity: Not Asked   Other Topics Concern  . None   Social History Narrative  . None   History reviewed. No pertinent family history. No Known Allergies Prior to Admission medications   Medication Sig Start Date End Date Taking? Authorizing Provider  citalopram (CELEXA) 40 MG tablet Take 40 mg by mouth daily. 04/21/16  Yes Historical Provider, MD  varenicline (CHANTIX) 1 MG tablet Take 1 tablet by mouth 2 (two) times daily. 10/31/15  Yes Historical Provider, MD  aspirin EC 325 MG tablet Take 1 tablet (325 mg total) by mouth 2 (two) times daily. 09/21/16   Naiping Donnelly Stager, MD  methocarbamol (ROBAXIN) 750 MG tablet Take 1 tablet (750 mg total) by mouth 2 (two) times daily as needed for muscle spasms. 09/21/16   Naiping Donnelly Stager, MD  ondansetron (ZOFRAN) 4 MG tablet Take 1-2 tablets (4-8 mg total) by mouth every 8 (eight) hours as needed for nausea or vomiting. 09/21/16   Tarry Kos, MD  oxyCODONE (OXY IR/ROXICODONE) 5 MG immediate release tablet Take 1-3 tablets (5-15 mg total) by mouth every 4 (four) hours as needed. 09/21/16   Tarry Kos, MD  oxyCODONE (OXYCONTIN) 10 mg 12 hr tablet Take 1 tablet (10 mg total) by mouth every 12  (twelve) hours. 09/21/16   Naiping Donnelly Stager, MD  promethazine (PHENERGAN) 25 MG tablet Take 1 tablet (25 mg total) by mouth every 6 (six) hours as needed for nausea. 09/21/16   Naiping Donnelly Stager, MD  senna-docusate (SENOKOT S) 8.6-50 MG tablet Take 1 tablet by mouth at bedtime as needed. 09/21/16   Tarry Kos, MD   Dg Tibia/fibula Right  Result Date: 09/21/2016 CLINICAL DATA:  Tibial fracture repair FLUOROSCOPY TIME:  3 minutes and 6 seconds. Images: 7 EXAM: RIGHT TIBIA AND FIBULA - 2 VIEW COMPARISON:  None. FINDINGS: An intramedullary rod crosses the tibial fracture with superior and inferior interlocking screws. IMPRESSION: Tibial fracture repair as above. Electronically Signed   By: Gerome Sam III M.D   On: 09/21/2016 16:31   Dg Tibia/fibula Right  Result Date: 09/20/2016 CLINICAL DATA:  Slipped on ice, pain EXAM: RIGHT TIBIA AND FIBULA - 2 VIEW COMPARISON:  None. FINDINGS: There is a proximal fibular fracture which demonstrates 1/2 bone with of posterior and lateral displacement of distal fracture fragment and approximately 6 mm of separation. No significant angulation. Additional fracture of the distal shaft of the tibia, demonstrating close to 1/2 shaft diameter of lateral displacement. IMPRESSION: 1. Displaced proximal fibular fracture 2. Displaced distal tibial shaft fracture Electronically Signed   By: Jasmine Pang M.D.   On: 09/20/2016  23:14   Dg Ankle Complete Right  Result Date: 09/20/2016 CLINICAL DATA:  Slipped on ice, pain to the tib-fib and ankle EXAM: RIGHT ANKLE - COMPLETE 3+ VIEW COMPARISON:  None FINDINGS: Small plantar calcaneal spur. Ankle mortise is symmetric. Partially visualized fracture of the distal diaphysis of the tibia, this demonstrates 1/3 shaft diameter of lateral displacement of distal fracture fragment. IMPRESSION: Partially visualized displaced fracture of the distal shaft of the tibia. Electronically Signed   By: Jasmine PangKim  Fujinaga M.D.   On: 09/20/2016 23:13   Dg  Tibia/fibula Right Port  Result Date: 09/21/2016 CLINICAL DATA:  ORIF of tibia fracture.  Initial encounter. EXAM: PORTABLE RIGHT TIBIA AND FIBULA - 2 VIEW COMPARISON:  09/20/2016 radiographs. 09/21/2016 intraoperative fluoroscopic images. FINDINGS: An antegrade intramedullary nail has been placed across the previously described spiral fracture of the distal tibial diaphysis. There are single proximal and distal interlocking screws. There is mild residual lateral displacement, improved from the preoperative examination. There is also slight posterior displacement. Oblique, mildly comminuted fracture of the proximal fibular shaft has also been reduced in the interim and is now in near anatomic alignment. The knee and ankle are grossly located. Skin staples are in place. IMPRESSION: Interval reduction of tibia and fibula fractures with tibia internal fixation as above. Electronically Signed   By: Sebastian AcheAllen  Grady M.D.   On: 09/21/2016 17:33   Dg C-arm 1-60 Min  Result Date: 09/21/2016 CLINICAL DATA:  Tibial fracture repair FLUOROSCOPY TIME:  3 minutes and 6 seconds. Images: 7 EXAM: DG C-ARM 61-120 MIN COMPARISON:  None. FINDINGS: An intramedullary rod crosses the tibial fracture with superior and inferior interlocking screws. The hardware is in good position. IMPRESSION: Placement of intramedullary tibial rod across a tibial fracture. Electronically Signed   By: Gerome Samavid  Williams III M.D   On: 09/21/2016 16:29   - pertinent xrays, CT, MRI studies were reviewed and independently interpreted  Positive ROS: All other systems have been reviewed and were otherwise negative with the exception of those mentioned in the HPI and as above.  Physical Exam: General: Alert, no acute distress Cardiovascular: No pedal edema Respiratory: No cyanosis, no use of accessory musculature GI: No organomegaly, abdomen is soft and non-tender Skin: No lesions in the area of chief complaint Neurologic: Sensation intact  distally Psychiatric: Patient is competent for consent with normal mood and affect Lymphatic: No axillary or cervical lymphadenopathy  MUSCULOSKELETAL:  - Compartment soft - NVI  Assessment: Right tib-fib fracture.  Plan: Plan is to treat this with intramedullary fixation. Admit overnight for observation.   Mayra ReelN. Michael Xu, MD [facility] [phone] 10:24 PM

## 2016-09-22 NOTE — Progress Notes (Signed)
   Subjective:  Patient reports pain as marked.  No events.  Objective:   VITALS:   Vitals:   09/21/16 1601 09/21/16 2226 09/22/16 0128 09/22/16 0605  BP: 133/85 138/73 131/72 125/61  Pulse: 90 87 83 79  Resp: 19 18 16 16   Temp: 98.7 F (37.1 C) 99.5 F (37.5 C) 98.8 F (37.1 C) 98.8 F (37.1 C)  TempSrc: Oral Oral Oral Oral  SpO2: 92% 93% 93% 95%  Weight:      Height:        Neurologically intact Neurovascular intact Sensation intact distally Intact pulses distally Dorsiflexion/Plantar flexion intact Incision: dressing C/D/I and no drainage No cellulitis present Compartment soft   No results found for: WBC, HGB, HCT, MCV, PLT   Assessment/Plan:  1 Day Post-Op   - Expected postop acute blood loss anemia - will monitor for symptoms - Up with PT/OT - DVT ppx - SCDs, ambulation, aspirin - NWB operative extremity - Pain control - Discharge planning  Glee ArvinMichael Christian Treadway 09/22/2016, 7:57 AM [phone]

## 2016-09-22 NOTE — Evaluation (Addendum)
Occupational Therapy Evaluation Patient Details Name: Daniel Potter MRN: [id number] DOB: 01-15-1981 Today's Date: 09/22/2016    History of Present Illness 36 y.o. male [facility] after falling on ice while drinking.  Pt is s/p R LE IM Nail and is now NWB right leg in CAM boot.  Pt with no other significant PMHx.    Clinical Impression   PTA, pt was independent with ADL and functional mobility and was working at [employer] as a Production designer, theatre/television/filmmanager. Pt currently requires mod assist with LB ADL and min assist for toilet transfers. Pt anxious concerning movement but motivated to participate with therapy and return to PLOF. Pt would benefit from continued OT services while admitted in order to maximize safety and independence with ADL. Recommend 24 hour supervision/assistance post-acute D/C as well as 3-in-1 BSC and shower seat for DME needs. OT will continue to follow acutely.    Follow Up Recommendations  No OT follow up;Supervision/Assistance - 24 hour    Equipment Recommendations  3 in 1 bedside commode;Tub/shower seat    Recommendations for Other Services       Precautions / Restrictions Precautions Precautions: Fall Precaution Comments: due to NWB status Restrictions Weight Bearing Restrictions: Yes RLE Weight Bearing: Non weight bearing      Mobility Bed Mobility Overal bed mobility: Needs Assistance Bed Mobility: Supine to Sit;Sit to Supine     Supine to sit: HOB elevated;Min assist Sit to supine: Min assist   General bed mobility comments: Min assist for R LE.  Transfers Overall transfer level: Needs assistance Equipment used: Rolling walker (2 wheeled) Transfers: Sit to/from Stand Sit to Stand: Min assist         General transfer comment: Min assist for safety.    Balance Overall balance assessment: Needs assistance Sitting-balance support: Feet supported;No upper extremity supported Sitting balance-Leahy Scale: Good     Standing balance support:  Bilateral upper extremity supported Standing balance-Leahy Scale: Poor Standing balance comment: Required min assist and single UE support to complete grooming tasks at sink.                            ADL Overall ADL's : Needs assistance/impaired     Grooming: Minimal assistance;Standing;Wash/dry hands Grooming Details (indicate cue type and reason): Requires single UE support. Upper Body Bathing: Set up;Sitting   Lower Body Bathing: Moderate assistance;Sit to/from stand   Upper Body Dressing : Set up;Sitting   Lower Body Dressing: Moderate assistance;Sit to/from stand   Toilet Transfer: Ambulation;BSC;RW;Minimal assistance   Toileting- Clothing Manipulation and Hygiene: Minimal assistance;Sit to/from stand       Functional mobility during ADLs: Moderate assistance;Rolling walker General ADL Comments: Educated pt and wife concerning donning/doffing CAM boot, safety during ADL, and use of DME for toilet transfer and shower transfer.      Vision Vision Assessment?: No apparent visual deficits   Perception     Praxis      Pertinent Vitals/Pain Pain Assessment: 0-10 Pain Score: 8  Faces Pain Scale: Hurts whole lot Pain Location: right lower leg Pain Descriptors / Indicators: Aching;Burning;Throbbing Pain Intervention(s): Limited activity within patient's tolerance;Monitored during session;Repositioned;Ice applied;Patient requesting pain meds-RN notified     Hand Dominance Right   Extremity/Trunk Assessment Upper Extremity Assessment Upper Extremity Assessment: Overall WFL for tasks assessed   Lower Extremity Assessment Lower Extremity Assessment: Defer to PT evaluation RLE Deficits / Details: right leg with normal post op pain and weakness.  Pt able to wiggle and feel his toes, knee 2/5, hip flexion 2+/5 RLE Sensation:  (intact to LT)   Cervical / Trunk Assessment Cervical / Trunk Assessment: Normal   Communication Communication Communication: No  difficulties   Cognition Arousal/Alertness: Awake/alert Behavior During Therapy: WFL for tasks assessed/performed Overall Cognitive Status: Within Functional Limits for tasks assessed                     General Comments       Exercises Exercises: Total Joint     Shoulder Instructions      Home Living Family/patient expects to be discharged to:: Private residence Living Arrangements: Spouse/significant other;Children Available Help at Discharge: Family;Available 24 hours/day (wife can take time off, but would like to return to work) Type of Home: House Home Access: Stairs to enter [employer] of Steps: 3 Entrance Stairs-Rails: Right Home Layout: Two level;Full bath on main level;Able to live on main level with bedroom/bathroom     Bathroom Shower/Tub: Tub/shower unit;Walk-in shower   Bathroom Toilet: Standard     Home Equipment: None (crutches delivered to room)          Prior Functioning/Environment Level of Independence: Independent        Comments: works at [employer]        OT Problem List: Decreased strength;Decreased range of motion;Decreased activity tolerance;Impaired balance (sitting and/or standing);Decreased safety awareness;Decreased knowledge of use of DME or AE;Decreased knowledge of precautions;Pain   OT Treatment/Interventions: Self-care/ADL training;Therapeutic exercise;Energy conservation;DME and/or AE instruction;Therapeutic activities;Patient/family education;Balance training    OT Goals(Current goals can be found in the care plan section) Acute Rehab OT Goals Patient Stated Goal: to get back to his normal self, work OT Goal Formulation: With patient Time For Goal Achievement: 09/29/16 Potential to Achieve Goals: Good ADL Goals Pt Will Perform Lower Body Bathing: with modified independence;sit to/from stand Pt Will Perform Lower Body Dressing: with modified independence;sit to/from stand Pt Will Transfer to Toilet: with  modified independence;ambulating;bedside commode (BSC over toilet) Pt Will Perform Toileting - Clothing Manipulation and hygiene: with modified independence;sit to/from stand Pt Will Perform Tub/Shower Transfer: Shower transfer;with supervision;3 in 1;rolling walker;ambulating Additional ADL Goal #1: Pt will identify 3 fall prevention strategies and independently incorporate these into daily routine in preparation for ADL participation.  OT Frequency: Min 2X/week   Barriers to D/C:            Co-evaluation              End of Session Equipment Utilized During Treatment: Gait belt;Rolling walker Nurse Communication: Mobility status;Patient requests pain meds  Activity Tolerance: Patient tolerated treatment well Patient left: in bed;with call bell/phone within reach;with family/visitor present   Time: 7829-5621 OT Time Calculation (min): 32 min Charges:  OT General Charges $OT Visit: 1 Procedure OT Evaluation $OT Eval Moderate Complexity: 1 Procedure OT Treatments $Self Care/Home Management : 8-22 mins G-Codes:    Doristine Section, OTR/L [phone] 09/22/2016, 5:21 PM

## 2016-09-22 NOTE — Progress Notes (Signed)
Orthopedic Tech Progress Note Patient Details:  Daniel BaltimoreBrian Potter [birth year] [id number]  Ortho Devices Type of Ortho Device: Crutches, CAM walker Ortho Device/Splint Interventions: Application   Saul FordyceJennifer C Unique Sillas 09/22/2016, 11:04 AM

## 2016-09-22 NOTE — Evaluation (Signed)
Physical Therapy Evaluation Patient Details Name: Daniel Potter MRN: [id number] DOB: May 23, 1981 Today's Date: 09/22/2016   History of Present Illness  36 y.o. male admitted to [facility] on 09/20/16 after falling on ice while drinking.  Pt is s/p R LE IM Nail and is now NWB right leg in CAM boot.  Pt with no other significant PMHx.   Clinical Impression  Pt was able, with min guard assist to get up and walk a short distance around his room.  He feels more steady on RW and was not yet ready to attempt crutches due to feeling a bit "woozy" on his feet.  We will keep crutches, though, as they will be easier for stairs to enter home.  I do not believe a WC would be a good ideas as he is 6'5" and would not fit well in a WC with elevating leg rests (I have not had anyone over 6"2 do well with the leg rests even at their longest extended length).   PT to follow acutely for deficits listed below.       Follow Up Recommendations No PT follow up;Supervision for mobility/OOB    Equipment Recommendations  Rolling walker with 5" wheels;3in1 (PT);Crutches    Recommendations for Other Services   NA    Precautions / Restrictions Precautions Precautions: Fall Precaution Comments: due to NWB status Restrictions Weight Bearing Restrictions: Yes RLE Weight Bearing: Non weight bearing      Mobility  Bed Mobility Overal bed mobility: Needs Assistance Bed Mobility: Supine to Sit     Supine to sit: Min assist;HOB elevated     General bed mobility comments: Min assist to help progress right leg over EOB pt using railing for leverage at trunk.   Transfers Overall transfer level: Needs assistance Equipment used: Rolling walker (2 wheeled) Transfers: Sit to/from Stand Sit to Stand: Min guard         General transfer comment: Min guard assist for safety from elevated bed.  Verbal cues for safe hand placement.  Pt prefered to use RW due to lightheadedness and feeling "woozy" from pain meds.    Ambulation/Gait Ambulation/Gait assistance: Min guard Ambulation Distance (Feet): 20 Feet Assistive device: Rolling walker (2 wheeled) Gait Pattern/deviations: Step-to pattern (hop to) Gait velocity: decreased Gait velocity interpretation: Below normal speed for age/gender General Gait Details: Pt used RW to hop around the end of the bed to the recliner chair on the other side of the room CAM boot donned in supine and pt able to maintain NWB throughout gait.  He did not want to try to use crutches bedcause he did not feel steady enough yet on his feet.          Balance Overall balance assessment: Needs assistance Sitting-balance support: Feet supported;No upper extremity supported Sitting balance-Leahy Scale: Good     Standing balance support: Bilateral upper extremity supported Standing balance-Leahy Scale: Poor                               Pertinent Vitals/Pain Pain Assessment: Faces Faces Pain Scale: Hurts whole lot Pain Location: right lower leg Pain Descriptors / Indicators: Aching;Burning Pain Intervention(s): Limited activity within patient's tolerance;Monitored during session;Repositioned    Home Living Family/patient expects to be discharged to:: Private residence Living Arrangements: Spouse/significant other;Children Available Help at Discharge: Family;Available 24 hours/day (wife can take time off, but would like to return to work ) Type of Home: House Home Access: Stairs  to enter Entrance Stairs-Rails: Right Entrance Stairs-Number of Steps: 3 Home Layout: Two level;Full bath on main level;Able to live on main level with bedroom/bathroom Home Equipment: None (crutches were delivered to room. )      Prior Function Level of Independence: Independent         Comments: works at [employer]        Extremity/Trunk Assessment   Upper Extremity Assessment Upper Extremity Assessment: Defer to OT evaluation    Lower Extremity  Assessment Lower Extremity Assessment: RLE deficits/detail RLE Deficits / Details: right leg with normal post op pain and weakness.  Pt able to wiggle and feel his toes, knee 2/5, hip flexion 2+/5 RLE Sensation:  (intact to LT)    Cervical / Trunk Assessment Cervical / Trunk Assessment: Normal  Communication   Communication: No difficulties  Cognition Arousal/Alertness: Awake/alert Behavior During Therapy: WFL for tasks assessed/performed Overall Cognitive Status: Within Functional Limits for tasks assessed                         Exercises Total Joint Exercises Ankle Circles/Pumps: Other (comment) (toe wiggles right) Quad Sets: AROM;Right;10 reps Heel Slides: AAROM;Right;10 reps Hip ABduction/ADduction: AAROM;Right;10 reps Straight Leg Raises: AAROM;Right;10 reps   Assessment/Plan    PT Assessment Patient needs continued PT services  PT Problem List Decreased balance;Decreased strength;Decreased range of motion;Decreased activity tolerance;Decreased mobility;Decreased knowledge of use of DME;Pain;Obesity;Decreased knowledge of precautions          PT Treatment Interventions DME instruction;Stair training;Gait training;Functional mobility training;Therapeutic activities;Therapeutic exercise;Balance training;Patient/family education;Manual techniques;Modalities    PT Goals (Current goals can be found in the Care Plan section)  Acute Rehab PT Goals Patient Stated Goal: to get back to his normal self, work PT Goal Formulation: With patient Time For Goal Achievement: 09/29/16 Potential to Achieve Goals: Good    Frequency Min 5X/week           End of Session Equipment Utilized During Treatment: Other (comment) (R CAM boot) Activity Tolerance: Patient limited by pain Patient left: in chair;with call bell/phone within reach;with family/visitor present           Time: [id number] PT Time Calculation (min) (ACUTE ONLY): 46 min   Charges:   PT Evaluation $PT  Eval Low Complexity: 1 Procedure PT Treatments $Gait Training: 8-22 mins $Therapeutic Exercise: 8-22 mins        Alanzo Lamb B. Ieasha Boerema, PT, DPT [phone]   09/22/2016, 4:00 PM

## 2016-09-23 LAB — GLUCOSE, CAPILLARY: Glucose-Capillary: 111 mg/dL — ABNORMAL HIGH (ref 65–99)

## 2016-09-23 NOTE — Progress Notes (Signed)
Pt woke up feeling anxious and short of breath, a little shaky, and "not feeling good". VSS, lungs sound clear upon auscultation. Sats are 98%, Bp=163/90, pulse=81. Blood sugar=111. I suspected he was having an anxiety attack and gave him some ativan from the prn list see MAR. With this and some reassurance from me he calmed down and shortly went back to sleep. Will continue to monitor.

## 2016-09-23 NOTE — Progress Notes (Signed)
Physical Therapy Treatment Patient Details Name: Daniel BaltimoreBrian Potter MRN: [id number] DOB: [birth year] Today's Date: 09/23/2016    History of Present Illness 36 y.o. male [facility] after falling on ice while drinking.  Pt is s/p R LE IM Nail and is now NWB right leg in CAM boot.  Pt with no other significant PMHx.     PT Comments    Pt was able to show safe ability to enter his home with stair training today (one rail and one crutch).  He still feels more confident with RW use vs crutches for gait.  Pt and wife report compliance with HEP program and I will defer decisions about OP PT to the physician at follow up.  I emphasized the importance of compliance with HEP to pt and his wife.  Pt is safe to d/c home from a mobility standpoint and he believe he is to go home later today. PT to follow acutely until d/c confirmed.     Follow Up Recommendations  No PT follow up;Supervision for mobility/OOB     Equipment Recommendations  Rolling walker with 5" wheels;3in1 (PT);Crutches    Recommendations for Other Services   NA     Precautions / Restrictions Precautions Precautions: Fall Precaution Comments: due to NWB status Required Braces or Orthoses: Other Brace/Splint Other Brace/Splint: CAM boot right ankle Restrictions RLE Weight Bearing: Non weight bearing    Mobility  Bed Mobility Overal bed mobility: Modified Independent Bed Mobility: Supine to Sit     Supine to sit: HOB elevated;Modified independent (Device/Increase time)     General bed mobility comments: Mod I to get EOB with HOB elevated and use of rails for support.   Transfers Overall transfer level: Needs assistance Equipment used: Rolling walker (2 wheeled) Transfers: Sit to/from Stand Sit to Stand: Supervision         General transfer comment: supervision for safety.  Safe hand placement without cues needed.   Ambulation/Gait Ambulation/Gait assistance: Min guard Ambulation Distance (Feet): 10  Feet Assistive device: Rolling walker (2 wheeled) Gait Pattern/deviations: Step-to pattern (hop to) Gait velocity: decreased   General Gait Details: Pt continues to feel more stable with RW.  So, we used RW for gait and crutches for staris.    Stairs Stairs: Yes   Stair Management: One rail Left;Forwards;With crutches;Step to pattern Number of Stairs: 2 General stair comments: Hop to pattern with left rail and right crutch.  Pt able to ascend and desend the stairs with min guard assist and handout given to family as a reminder of where to put the crutch.  Pt's wife present for stair training.         Balance Overall balance assessment: Needs assistance Sitting-balance support: Feet supported;No upper extremity supported Sitting balance-Leahy Scale: Good Sitting balance - Comments: Pt able to donn his own left sock EOB.    Standing balance support: Bilateral upper extremity supported Standing balance-Leahy Scale: Fair Standing balance comment: supervision                    Cognition Arousal/Alertness: Awake/alert Behavior During Therapy: WFL for tasks assessed/performed Overall Cognitive Status: Within Functional Limits for tasks assessed                         General Comments General comments (skin integrity, edema, etc.): Pt reports compliance with HEP      Pertinent Vitals/Pain Pain Assessment: 0-10 Pain Score: 5  Pain Location: right ankle  Pain Descriptors / Indicators: Aching;Burning;Throbbing Pain Intervention(s): Limited activity within patient's tolerance;Monitored during session;Repositioned;Premedicated before session           PT Goals (current goals can now be found in the care plan section) Acute Rehab PT Goals Patient Stated Goal: to get back to his normal self, work Progress towards PT goals: Progressing toward goals    Frequency    Min 5X/week      PT Plan Current plan remains appropriate       End of Session  Equipment Utilized During Treatment: Other (comment) (CAM boot R foot) Activity Tolerance: Patient limited by pain Patient left: in chair;with call bell/phone within reach;with family/visitor present     Time: 1610-9604 PT Time Calculation (min) (ACUTE ONLY): 24 min  Charges:  $Gait Training: 23-37 mins                       Brendia Dampier B. Silvino Selman, PT, DPT [phone]   09/23/2016, 3:34 PM

## 2016-09-23 NOTE — Discharge Summary (Signed)
Physician Discharge Summary      Patient ID: Daniel BaltimoreBrian Potter MRN: [id number] DOB/AGE: 05/09/1981 36 y.o.  Admit date: 09/20/2016 Discharge date: 09/23/2016  Admission Diagnoses:  <principal problem not specified>  Discharge Diagnoses:  Active Problems:   Closed tibia fracture   Closed displaced oblique fracture of shaft of right tibia   Closed fracture of right distal tibia   History reviewed. No pertinent past medical history.  Surgeries: Procedure(s): INTRAMEDULLARY (IM) NAIL TIBIAL on 09/20/2016 - 09/21/2016   Consultants (if any):   Discharged Condition: Improved  Hospital Course: Daniel BaltimoreBrian Potter is an 36 y.o. male who was admitted 09/20/2016 with a diagnosis of <principal problem not specified> and went to the operating room on 09/20/2016 - 09/21/2016 and underwent the above named procedures.    He was given perioperative antibiotics:  Anti-infectives    Start     Dose/Rate Route Frequency Ordered Stop   09/21/16 1630  ceFAZolin (ANCEF) IVPB 2g/100 mL premix     2 g 200 mL/hr over 30 Minutes Intravenous Every 6 hours 09/21/16 1617 09/22/16 0715    .  He was given sequential compression devices, early ambulation, and aspirin for DVT prophylaxis.  He benefited maximally from the hospital stay and there were no complications.    Recent vital signs:  Vitals:   09/22/16 2200 09/23/16 0400  BP: 138/88 (!) 163/90  Pulse: 85 81  Resp:    Temp: 98.8 F (37.1 C) 98.2 F (36.8 C)    Recent laboratory studies:  No results found for: HGB No results found for: WBC, PLT No results found for: INR No results found for: NA, K, CL, CO2, BUN, CREATININE, GLUCOSE  Discharge Medications:   Allergies as of 09/23/2016   No Known Allergies     Medication List    TAKE these medications   aspirin EC 325 MG tablet Take 1 tablet (325 mg total) by mouth 2 (two) times daily.   citalopram 40 MG tablet Commonly known as:  CELEXA Take 40 mg by mouth daily.   methocarbamol 750 MG  tablet Commonly known as:  ROBAXIN Take 1 tablet (750 mg total) by mouth 2 (two) times daily as needed for muscle spasms.   ondansetron 4 MG tablet Commonly known as:  ZOFRAN Take 1-2 tablets (4-8 mg total) by mouth every 8 (eight) hours as needed for nausea or vomiting.   oxyCODONE 5 MG immediate release tablet Commonly known as:  Oxy IR/ROXICODONE Take 1-3 tablets (5-15 mg total) by mouth every 4 (four) hours as needed.   oxyCODONE 10 mg 12 hr tablet Commonly known as:  OXYCONTIN Take 1 tablet (10 mg total) by mouth every 12 (twelve) hours.   promethazine 25 MG tablet Commonly known as:  PHENERGAN Take 1 tablet (25 mg total) by mouth every 6 (six) hours as needed for nausea.   senna-docusate 8.6-50 MG tablet Commonly known as:  SENOKOT S Take 1 tablet by mouth at bedtime as needed.   varenicline 1 MG tablet Commonly known as:  CHANTIX Take 1 tablet by mouth 2 (two) times daily.            Durable Medical Equipment        Start     Ordered   09/21/16 1618  DME Walker rolling  Once    Question:  Patient needs a walker to treat with the following condition  Answer:  History of open reduction and internal fixation (ORIF) procedure   09/21/16 1617   09/21/16 1618  DME 3 n 1  Once     09/21/16 1617   09/21/16 1618  DME Bedside commode  Once    Question:  Patient needs a bedside commode to treat with the following condition  Answer:  History of open reduction and internal fixation (ORIF) procedure   09/21/16 1617      Diagnostic Studies: Dg Tibia/fibula Right  Result Date: 09/21/2016 CLINICAL DATA:  Tibial fracture repair FLUOROSCOPY TIME:  3 minutes and 6 seconds. Images: 7 EXAM: RIGHT TIBIA AND FIBULA - 2 VIEW COMPARISON:  None. FINDINGS: An intramedullary rod crosses the tibial fracture with superior and inferior interlocking screws. IMPRESSION: Tibial fracture repair as above. Electronically Signed   By: Gerome Sam III M.D   On: 09/21/2016 16:31   Dg  Tibia/fibula Right  Result Date: 09/20/2016 CLINICAL DATA:  Slipped on ice, pain EXAM: RIGHT TIBIA AND FIBULA - 2 VIEW COMPARISON:  None. FINDINGS: There is a proximal fibular fracture which demonstrates 1/2 bone with of posterior and lateral displacement of distal fracture fragment and approximately 6 mm of separation. No significant angulation. Additional fracture of the distal shaft of the tibia, demonstrating close to 1/2 shaft diameter of lateral displacement. IMPRESSION: 1. Displaced proximal fibular fracture 2. Displaced distal tibial shaft fracture Electronically Signed   By: Jasmine Pang M.D.   On: 09/20/2016 23:14   Dg Ankle Complete Right  Result Date: 09/20/2016 CLINICAL DATA:  Slipped on ice, pain to the tib-fib and ankle EXAM: RIGHT ANKLE - COMPLETE 3+ VIEW COMPARISON:  None FINDINGS: Small plantar calcaneal spur. Ankle mortise is symmetric. Partially visualized fracture of the distal diaphysis of the tibia, this demonstrates 1/3 shaft diameter of lateral displacement of distal fracture fragment. IMPRESSION: Partially visualized displaced fracture of the distal shaft of the tibia. Electronically Signed   By: Jasmine Pang M.D.   On: 09/20/2016 23:13   Dg Tibia/fibula Right Port  Result Date: 09/21/2016 CLINICAL DATA:  ORIF of tibia fracture.  Initial encounter. EXAM: PORTABLE RIGHT TIBIA AND FIBULA - 2 VIEW COMPARISON:  09/20/2016 radiographs. 09/21/2016 intraoperative fluoroscopic images. FINDINGS: An antegrade intramedullary nail has been placed across the previously described spiral fracture of the distal tibial diaphysis. There are single proximal and distal interlocking screws. There is mild residual lateral displacement, improved from the preoperative examination. There is also slight posterior displacement. Oblique, mildly comminuted fracture of the proximal fibular shaft has also been reduced in the interim and is now in near anatomic alignment. The knee and ankle are grossly  located. Skin staples are in place. IMPRESSION: Interval reduction of tibia and fibula fractures with tibia internal fixation as above. Electronically Signed   By: Sebastian Ache M.D.   On: 09/21/2016 17:33   Dg C-arm 1-60 Min  Result Date: 09/21/2016 CLINICAL DATA:  Tibial fracture repair FLUOROSCOPY TIME:  3 minutes and 6 seconds. Images: 7 EXAM: DG C-ARM 61-120 MIN COMPARISON:  None. FINDINGS: An intramedullary rod crosses the tibial fracture with superior and inferior interlocking screws. The hardware is in good position. IMPRESSION: Placement of intramedullary tibial rod across a tibial fracture. Electronically Signed   By: Gerome Sam III M.D   On: 09/21/2016 16:29    Disposition: Final discharge disposition not confirmed  Discharge Instructions    Call MD / Call 911    Complete by:  As directed    If you experience chest pain or shortness of breath, CALL 911 and be transported to the hospital emergency room.  If you develope a  fever above 101.5 F, pus (white drainage) or increased drainage or redness at the wound, or calf pain, call your surgeon's office.   Constipation Prevention    Complete by:  As directed    Drink plenty of fluids.  Prune juice may be helpful.  You may use a stool softener, such as Colace (over the counter) 100 mg twice a day.  Use MiraLax (over the counter) for constipation as needed.   Driving restrictions    Complete by:  As directed    No driving while taking narcotic pain meds.   Increase activity slowly as tolerated    Complete by:  As directed       Follow-up Information    Glee Arvin, MD Follow up in 2 week(s).   Specialty:  Orthopedic Surgery Why:  For suture removal, For wound re-check Contact information: [address] [place] [address] [phone]            Signed: Glee Arvin 09/23/2016, 7:57 AM

## 2016-09-23 NOTE — Progress Notes (Addendum)
Occupational Therapy Treatment/Discharge Patient Details Name: Daniel Potter MRN: [id number] DOB: 25-Oct-1980 Today's Date: 09/23/2016    History of present illness 36 y.o. male admitted to [facility] on 09/20/16 after falling on ice while drinking.  Pt is s/p R LE IM Nail and is now NWB right leg in CAM boot.  Pt with no other significant PMHx.    OT comments  Pt progressing well toward OT goals. Educated pt and wife concerning fall prevention, energy conservation, and safe shower transfer using RW and 3-in-1 and they demonstrate understanding. Pt required min guard assist for all functional mobility including shower transfer and mod assist for LB dressing. Pt's wife demonstrates ability to provide all necessary assistance at this time and will be at home with pt 24 hours per day initially.  No further acute OT needs identified and pt/wife report no further questions. D/C plan remains appropriate. OT will sign off.   Follow Up Recommendations  No OT follow up;Supervision/Assistance - 24 hour    Equipment Recommendations  3 in 1 bedside commode;Tub/shower seat    Recommendations for Other Services      Precautions / Restrictions Precautions Precautions: Fall Precaution Comments: due to NWB status Restrictions Weight Bearing Restrictions: Yes RLE Weight Bearing: Non weight bearing       Mobility Bed Mobility Overal bed mobility: Needs Assistance Bed Mobility: Supine to Sit;Sit to Supine           General bed mobility comments: Supervision  Transfers Overall transfer level: Needs assistance Equipment used: Rolling walker (2 wheeled) Transfers: Sit to/from Stand Sit to Stand: Min guard         General transfer comment: Min guard for safety    Balance Overall balance assessment: Needs assistance Sitting-balance support: Feet supported;No upper extremity supported Sitting balance-Leahy Scale: Good     Standing balance support: Bilateral upper extremity supported;During  functional activity Standing balance-Leahy Scale: Poor Standing balance comment: Min guard assist for standing with B UE support. Requires single UE support for grooming tasks at sink.                   ADL Overall ADL's : Needs assistance/impaired     Grooming: With caregiver independent assisting;Minimal assistance;Wash/dry face;Standing               Lower Body Dressing: Moderate assistance;Sit to/from stand;With caregiver independent assisting   Toilet Transfer: Min guard;Ambulation;RW       Tub/ Shower Transfer: Walk-in shower;Min guard;Ambulation;3 in 1;Rolling walker   Functional mobility during ADLs: Min guard;Rolling walker General ADL Comments: Pt and wife educated on use of 3-in-1 for shower seat as well as LB dressing techniques. Pt's wife demonstrates ability to provide necessary assistance.      Vision                     Perception     Praxis      Cognition   Behavior During Therapy: WFL for tasks assessed/performed Overall Cognitive Status: Within Functional Limits for tasks assessed                       Extremity/Trunk Assessment               Exercises     Shoulder Instructions       General Comments      Pertinent Vitals/ Pain       Pain Assessment: 0-10 Pain Score: 5  Pain Location: right lower leg  Pain Descriptors / Indicators: Aching;Burning;Throbbing Pain Intervention(s): Limited activity within patient's tolerance;Monitored during session;Repositioned;RN gave pain meds during session  Home Living                                          Prior Functioning/Environment              Frequency  Min 2X/week        Progress Toward Goals  OT Goals(current goals can now be found in the care plan section)  Progress towards OT goals: Progressing toward goals  Acute Rehab OT Goals Patient Stated Goal: to get back to his normal self, work OT Goal Formulation: With  patient Time For Goal Achievement: 09/29/16 Potential to Achieve Goals: Good ADL Goals Pt Will Perform Lower Body Bathing: with modified independence;sit to/from stand Pt Will Perform Lower Body Dressing: with modified independence;sit to/from stand Pt Will Transfer to Toilet: with modified independence;ambulating;bedside commode (BSC over toilet) Pt Will Perform Toileting - Clothing Manipulation and hygiene: with modified independence;sit to/from stand Pt Will Perform Tub/Shower Transfer: Shower transfer;with supervision;3 in 1;rolling walker;ambulating Additional ADL Goal #1: Pt will identify 3 fall prevention strategies and independently incorporate these into daily routine in preparation for ADL participation.  Plan Discharge plan remains appropriate    Co-evaluation                 End of Session Equipment Utilized During Treatment: Gait belt;Rolling walker   Activity Tolerance Patient tolerated treatment well   Patient Left in bed;with call bell/phone within reach;with family/visitor present (Sitting at EOB)   Nurse Communication Mobility status;Patient requests pain meds (DME needs)        Time: [id number] OT Time Calculation (min): 34 min  Charges: OT General Charges $OT Visit: 1 Procedure OT Treatments $Self Care/Home Management : 23-37 mins  Doristine SectionCharity A Almyra Birman, OTR/L [phone] 09/23/2016, 11:31 AM

## 2016-09-23 NOTE — Progress Notes (Signed)
Pt is alert and oriented discharged home with spouse with with equipment.

## 2016-09-23 NOTE — Progress Notes (Signed)
   Subjective:  Patient reports pain as marked.  No events.  Objective:   VITALS:   Vitals:   09/22/16 0605 09/22/16 1500 09/22/16 2200 09/23/16 0400  BP: 125/61 (!) 142/77 138/88 (!) 163/90  Pulse: 79 91 85 81  Resp: 16 16    Temp: 98.8 F (37.1 C) 98.6 F (37 C) 98.8 F (37.1 C) 98.2 F (36.8 C)  TempSrc: Oral Oral Oral Oral  SpO2: 95% 96% 91%   Weight:      Height:        Neurologically intact Neurovascular intact Sensation intact distally Intact pulses distally Dorsiflexion/Plantar flexion intact Incision: dressing C/D/I and no drainage No cellulitis present Compartment soft   No results found for: WBC, HGB, HCT, MCV, PLT   Assessment/Plan:  2 Days Post-Op   - Expected postop acute blood loss anemia - will monitor for symptoms - cleared PT and OT yesterday - DVT ppx - SCDs, ambulation, aspirin - NWB operative extremity - Pain control - Discharge planning - home today  Daniel Potter 09/23/2016, 7:55 AM [phone]

## 2016-09-24 ENCOUNTER — Telehealth (INDEPENDENT_AMBULATORY_CARE_PROVIDER_SITE_OTHER): Payer: Self-pay | Admitting: Orthopaedic Surgery

## 2016-09-24 ENCOUNTER — Encounter (HOSPITAL_COMMUNITY): Payer: Self-pay | Admitting: Orthopaedic Surgery

## 2016-09-24 NOTE — Telephone Encounter (Signed)
Patient's wife called advised the Rx's for Oxycodone and Oxycotin need prior approval before the Rx's can be filled. [name] advised Daniel Potter is itching really bad and want to know if there is something other than benedryl he can take to stop the itching. She advised patient is not sleeping at all. The number to the pharmacy is [phone]. The number to [place] is [phone]

## 2016-09-24 NOTE — Telephone Encounter (Signed)
yes

## 2016-09-24 NOTE — Telephone Encounter (Signed)
Okay to do PA or would you like to prescribe something else?

## 2016-09-25 ENCOUNTER — Telehealth (INDEPENDENT_AMBULATORY_CARE_PROVIDER_SITE_OTHER): Payer: Self-pay | Admitting: Orthopaedic Surgery

## 2016-09-25 MED ORDER — MAGIC MOUTHWASH: ORAL | 0 refills | Status: DC

## 2016-09-25 MED ORDER — NYSTATIN 100000 UNIT/ML MT SUSP: OROMUCOSAL | 0 refills | Status: DC

## 2016-09-25 NOTE — Telephone Encounter (Signed)
Gargle in mouth twice a day as needed

## 2016-09-25 NOTE — Telephone Encounter (Signed)
Sent Rx to [facility], Called [employer] and they state member needs to call to get reimbursement  [phone]

## 2016-09-25 NOTE — Telephone Encounter (Signed)
Needing verbal authorization on Rx Oxycodone and Oxycotin  (pt has [employer]) Patient paid out of pocket and needs Reimbursement.  Also according to patient's wife, it appears that he has thrush and patient is struggling to sleep....just unable to get comfortable.  She describes him as being MISERABLE, so please advise.

## 2016-09-25 NOTE — Telephone Encounter (Signed)
Please advise on message. 

## 2016-09-25 NOTE — Telephone Encounter (Signed)
[facility] called stating magic mouthwash has Nystatin in it, asking if Dr. Roda ShuttersXu wants to prescribe both. If so what are the directions on the mouthwash. CB: [phone]

## 2016-09-25 NOTE — Telephone Encounter (Signed)
Called pts wife and she states patient has Oral Thrush and has stopped eating and is in a lot of pain worse than Su. Wants to know if he needs something called into the pharm.

## 2016-09-25 NOTE — Telephone Encounter (Signed)
1. Nystatin 400,000-600,000 units QID for thrush 2. Magic mouthwash daily prn

## 2016-09-26 NOTE — Telephone Encounter (Signed)
Called pharmacist.

## 2016-10-07 ENCOUNTER — Ambulatory Visit (INDEPENDENT_AMBULATORY_CARE_PROVIDER_SITE_OTHER): Payer: BLUE CROSS/BLUE SHIELD

## 2016-10-07 ENCOUNTER — Ambulatory Visit (INDEPENDENT_AMBULATORY_CARE_PROVIDER_SITE_OTHER): Payer: BLUE CROSS/BLUE SHIELD | Admitting: Orthopaedic Surgery

## 2016-10-07 DIAGNOSIS — M79671 Pain in right foot: Secondary | ICD-10-CM | POA: Insufficient documentation

## 2016-10-07 DIAGNOSIS — S82231D Displaced oblique fracture of shaft of right tibia, subsequent encounter for closed fracture with routine healing: Secondary | ICD-10-CM | POA: Diagnosis not present

## 2016-10-07 NOTE — Progress Notes (Signed)
Daniel Potter is 2 weeks status post IM nailing right tibia fracture. He is complaining mainly of right foot pain. He is not taking any pain medicines. Incisions are all well-healed. He has no joint effusion. He has improving ankle and knee range of motion. The x-ray show stable fixation of the fracture. The staples were removed. Continue nonweightbearing. Anticipate out of work is 12 weeks from date of surgery. Follow-up in 4 weeks with repeat 2 view x-rays of the right tib-fib

## 2016-10-28 ENCOUNTER — Other Ambulatory Visit (INDEPENDENT_AMBULATORY_CARE_PROVIDER_SITE_OTHER): Payer: Self-pay | Admitting: Orthopaedic Surgery

## 2016-10-28 NOTE — Telephone Encounter (Signed)
Please advise 

## 2016-11-04 ENCOUNTER — Ambulatory Visit (INDEPENDENT_AMBULATORY_CARE_PROVIDER_SITE_OTHER): Payer: Self-pay

## 2016-11-04 ENCOUNTER — Encounter (INDEPENDENT_AMBULATORY_CARE_PROVIDER_SITE_OTHER): Payer: Self-pay | Admitting: Orthopaedic Surgery

## 2016-11-04 ENCOUNTER — Ambulatory Visit (INDEPENDENT_AMBULATORY_CARE_PROVIDER_SITE_OTHER): Payer: BLUE CROSS/BLUE SHIELD | Admitting: Orthopaedic Surgery

## 2016-11-04 DIAGNOSIS — S82231D Displaced oblique fracture of shaft of right tibia, subsequent encounter for closed fracture with routine healing: Secondary | ICD-10-CM

## 2016-11-04 NOTE — Progress Notes (Signed)
Daniel Potter is 6 weeks status post intramedullary fixation of his right tibia fracture. He admits to weightbearing on the right lower extremity. He does have swelling in discomfort in the ankle. He's been taking ibuprofen and elevating it. Physical exam shows 2+ pitting edema with well-healed surgical scars. X-ray show mild bending of the distal interlocking screw from movement of the tibial nail. The fracture itself appears to be healing with early bridging callus. At this point underwent officially advance him to weight-bear as tolerated with a cam walker. Physical therapy referral made. I'll see him back in 6 weeks with repeat 2 view x-rays of the right tib-fib

## 2016-11-14 ENCOUNTER — Telehealth (INDEPENDENT_AMBULATORY_CARE_PROVIDER_SITE_OTHER): Payer: Self-pay | Admitting: Orthopaedic Surgery

## 2016-11-14 NOTE — Telephone Encounter (Signed)
Okay 

## 2016-11-14 NOTE — Telephone Encounter (Signed)
FYI

## 2016-11-14 NOTE — Telephone Encounter (Signed)
[facility] CALLING TO INFORM PT WAS STARTING HOME HEALTH TODAY.  [phone]

## 2016-11-17 ENCOUNTER — Telehealth (INDEPENDENT_AMBULATORY_CARE_PROVIDER_SITE_OTHER): Payer: Self-pay | Admitting: *Deleted

## 2016-11-17 NOTE — Telephone Encounter (Signed)
See message below °

## 2016-11-17 NOTE — Telephone Encounter (Signed)
Okay please set up for outpatient physical therapy [facility] [place].

## 2016-11-17 NOTE — Telephone Encounter (Signed)
Daniel SalaamJoey Hayes from [facility] called this afternoon in regards to home health. This patient is not appropriate for home health. He needs to be set up for outpatient therapy he is not a candidate for home health with [facility]. Thank you

## 2016-11-17 NOTE — Telephone Encounter (Signed)
Please advise on message. 

## 2016-11-18 NOTE — Telephone Encounter (Signed)
Faxed form.

## 2016-12-16 ENCOUNTER — Encounter (INDEPENDENT_AMBULATORY_CARE_PROVIDER_SITE_OTHER): Payer: Self-pay | Admitting: Orthopaedic Surgery

## 2016-12-16 ENCOUNTER — Ambulatory Visit (INDEPENDENT_AMBULATORY_CARE_PROVIDER_SITE_OTHER): Payer: Self-pay

## 2016-12-16 ENCOUNTER — Ambulatory Visit (INDEPENDENT_AMBULATORY_CARE_PROVIDER_SITE_OTHER): Payer: BLUE CROSS/BLUE SHIELD | Admitting: Orthopaedic Surgery

## 2016-12-16 DIAGNOSIS — S82231D Displaced oblique fracture of shaft of right tibia, subsequent encounter for closed fracture with routine healing: Secondary | ICD-10-CM

## 2016-12-16 NOTE — Progress Notes (Signed)
Daniel Potter is almost 3 months status post intramedullary fixation of a right tibia fracture. He is doing physical therapy and advancing well. He does have expected swelling and discomfort especially at the end of the day. Overall he is improving clinically. X-ray show stable fixation with abundant callus formation. I will like him to continue physical therapy for his full 16 visits. 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 He is able to sit if he starts to have any pain or discomfort. He needs to let pain be his guide. I will like to see him back in 2 months for repeat 2 view x-rays of the right tib-fib. Questions encouraged and answered.

## 2017-01-27 ENCOUNTER — Ambulatory Visit (INDEPENDENT_AMBULATORY_CARE_PROVIDER_SITE_OTHER): Payer: BLUE CROSS/BLUE SHIELD | Admitting: Orthopaedic Surgery

## 2017-04-22 DIAGNOSIS — Z720 Tobacco use: Secondary | ICD-10-CM | POA: Insufficient documentation

## 2018-06-02 IMAGING — RF DG TIBIA/FIBULA 2V*R*
1 series · 7 of 7 positions shown · non-contrast
Comparison: None.

CLINICAL DATA: Tibial fracture repair

FLUOROSCOPY TIME:  3 minutes and 6 seconds.
Images: 7
EXAM:
RIGHT TIBIA AND FIBULA - 2 VIEW

[Series 1: run · 7 of 7 slices shown]
[im 1/7]
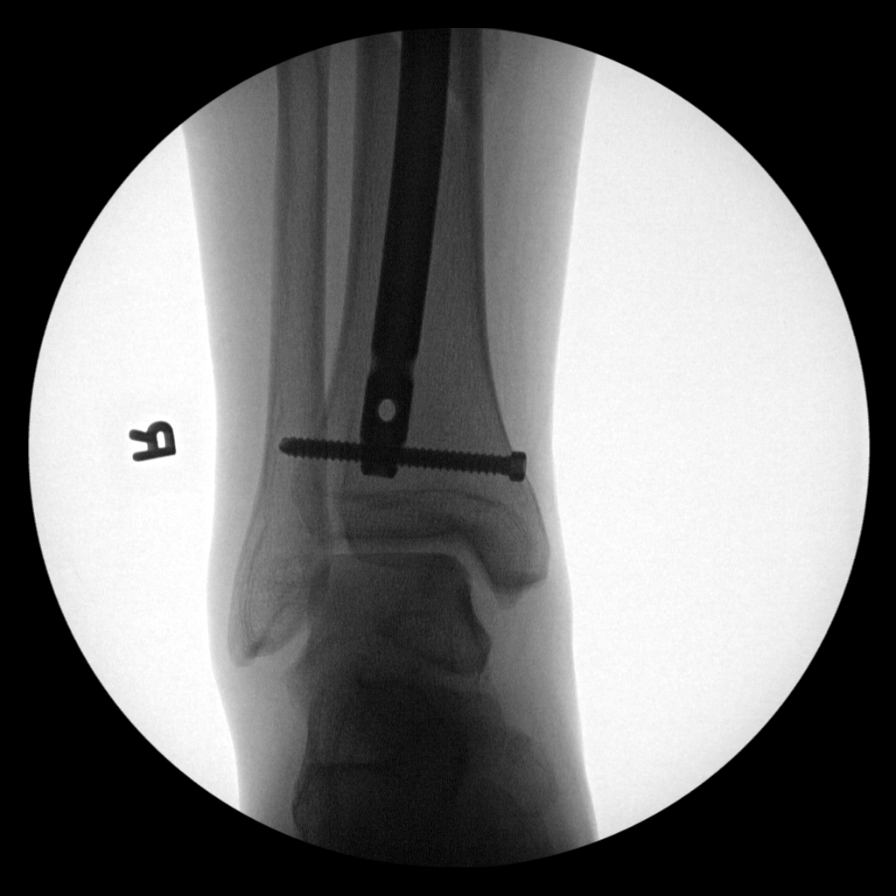
[im 2/7]
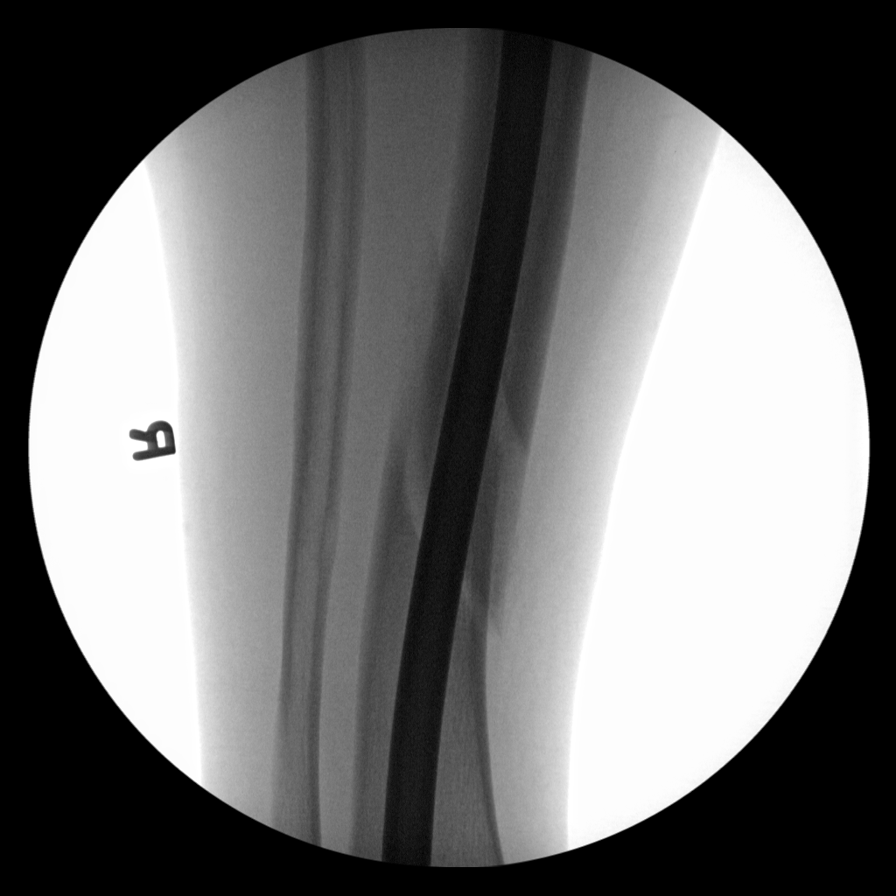
[im 3/7]
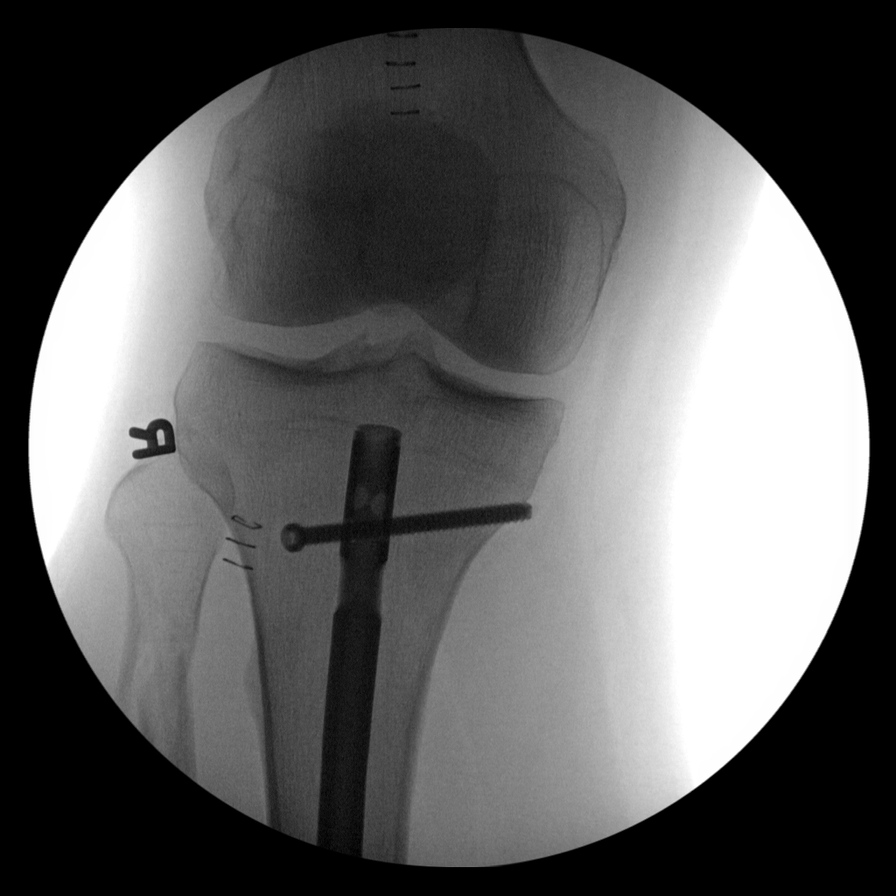
[im 4/7]
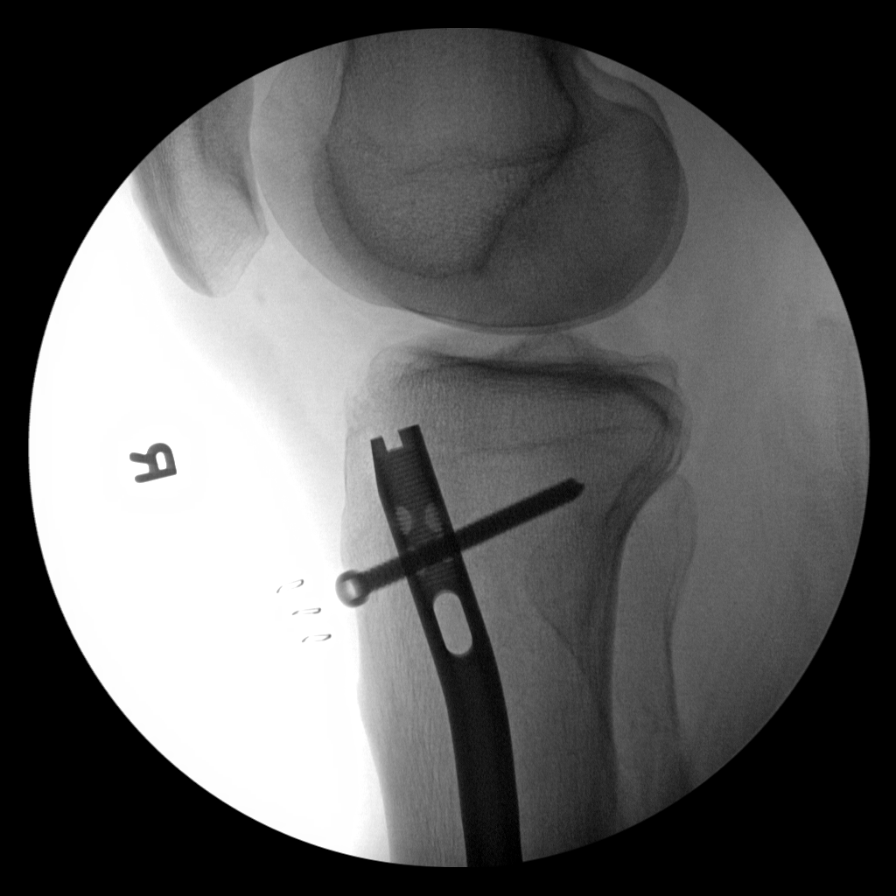
[im 5/7]
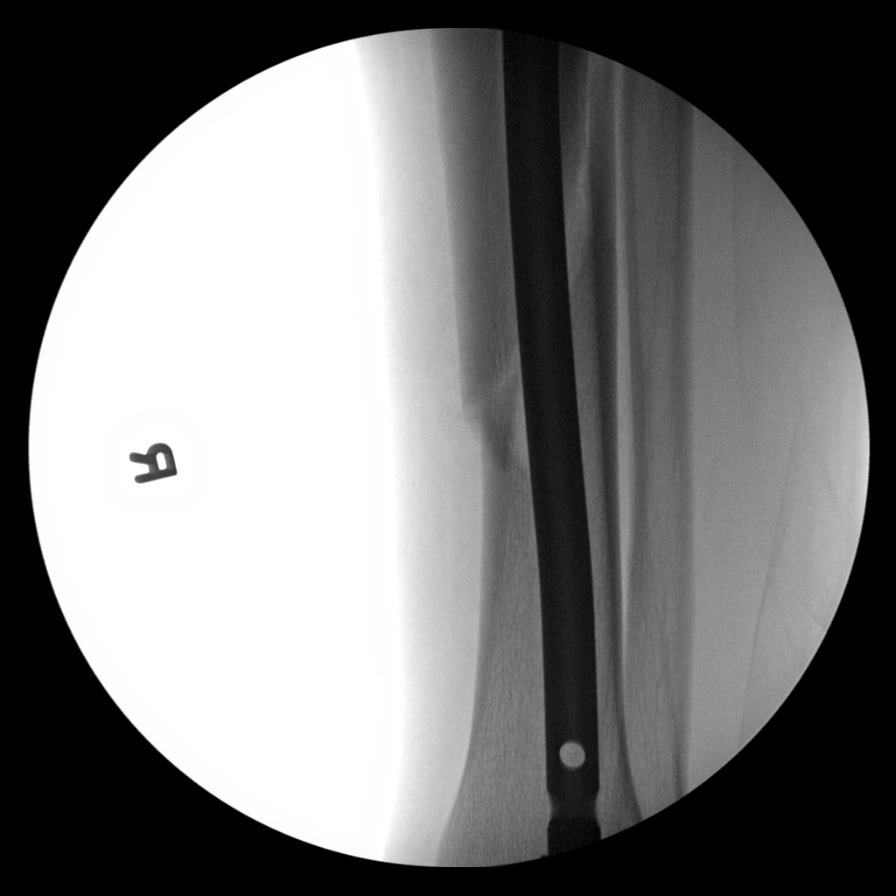
[im 6/7]
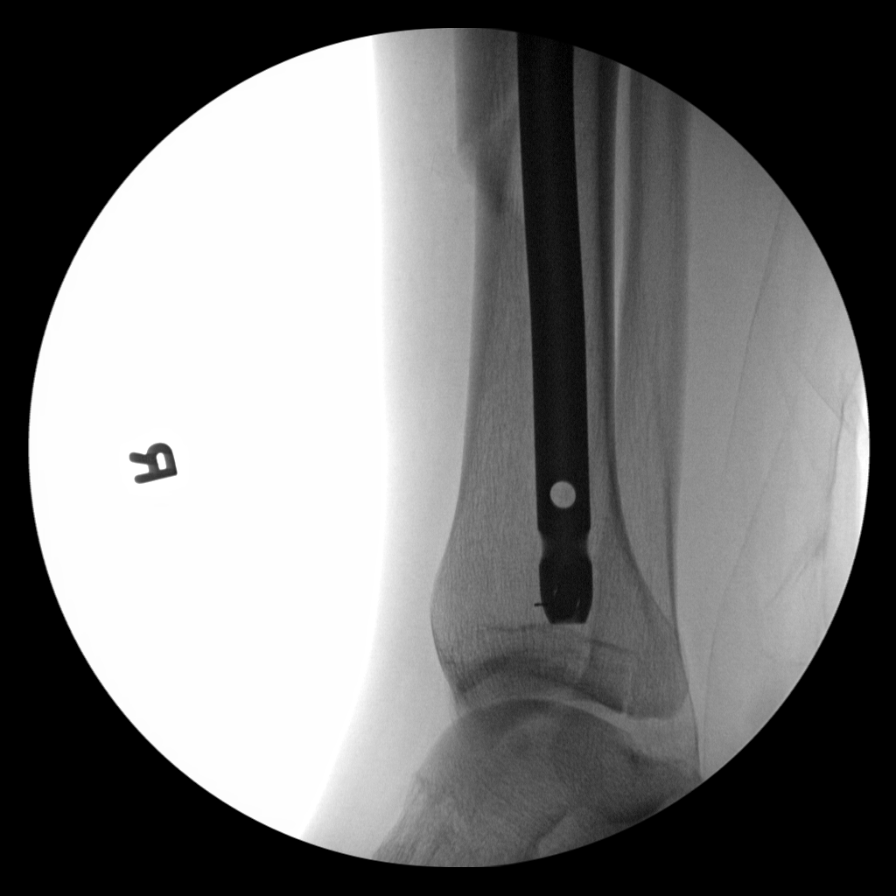
[im 7/7]
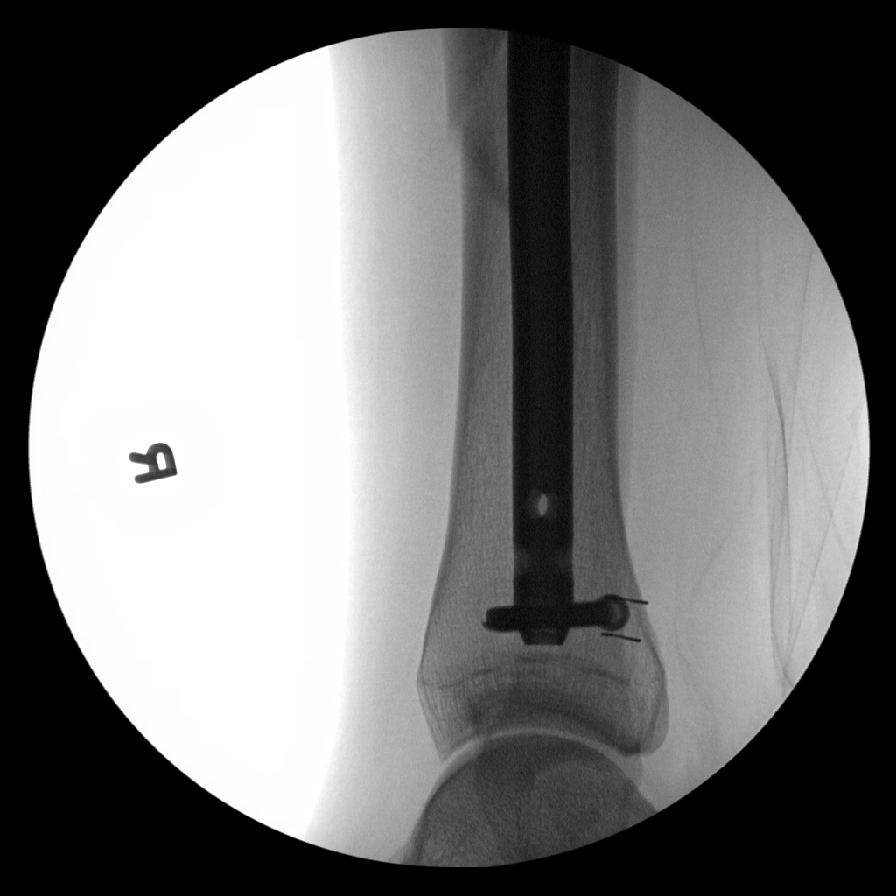

[7 of 7 positions shown; findings below may reference images not displayed]

FINDINGS: An intramedullary rod crosses the tibial fracture with superior and
inferior interlocking screws.
IMPRESSION: Tibial fracture repair as above.

## 2018-06-02 IMAGING — CR DG TIBIA/FIBULA PORT 2V*R*
4 series · 4 of 4 positions shown · non-contrast
Comparison: 09/20/2016 radiographs. 09/21/2016 intraoperative
fluoroscopic images.

CLINICAL DATA: ORIF of tibia fracture.  Initial encounter.

EXAM:
PORTABLE RIGHT TIBIA AND FIBULA - 2 VIEW

[AP (1 of 2)]
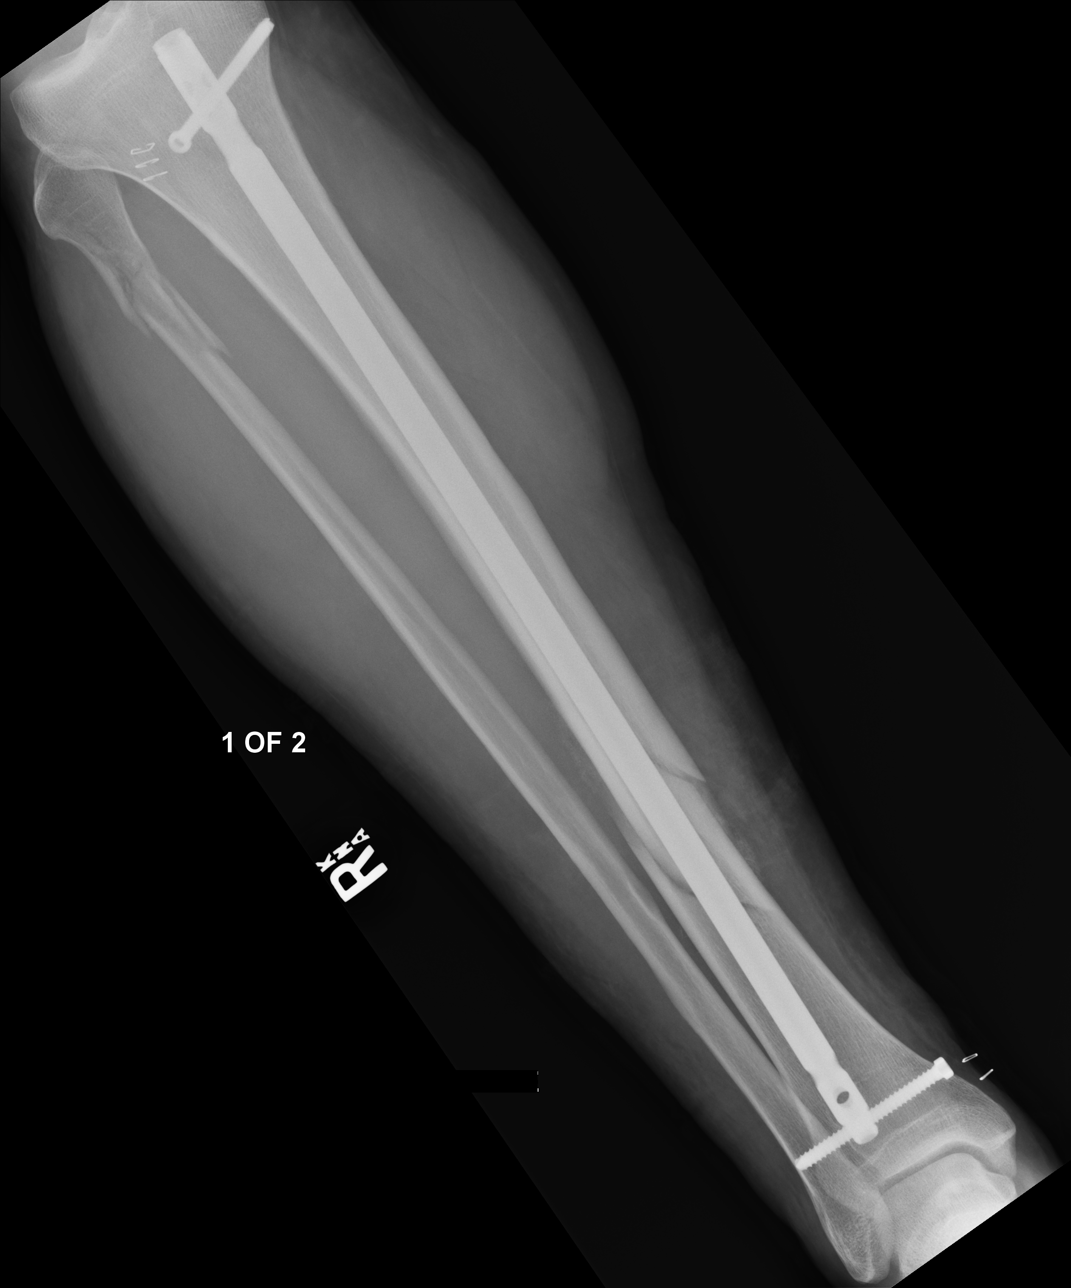

[lateral (1 of 2)]
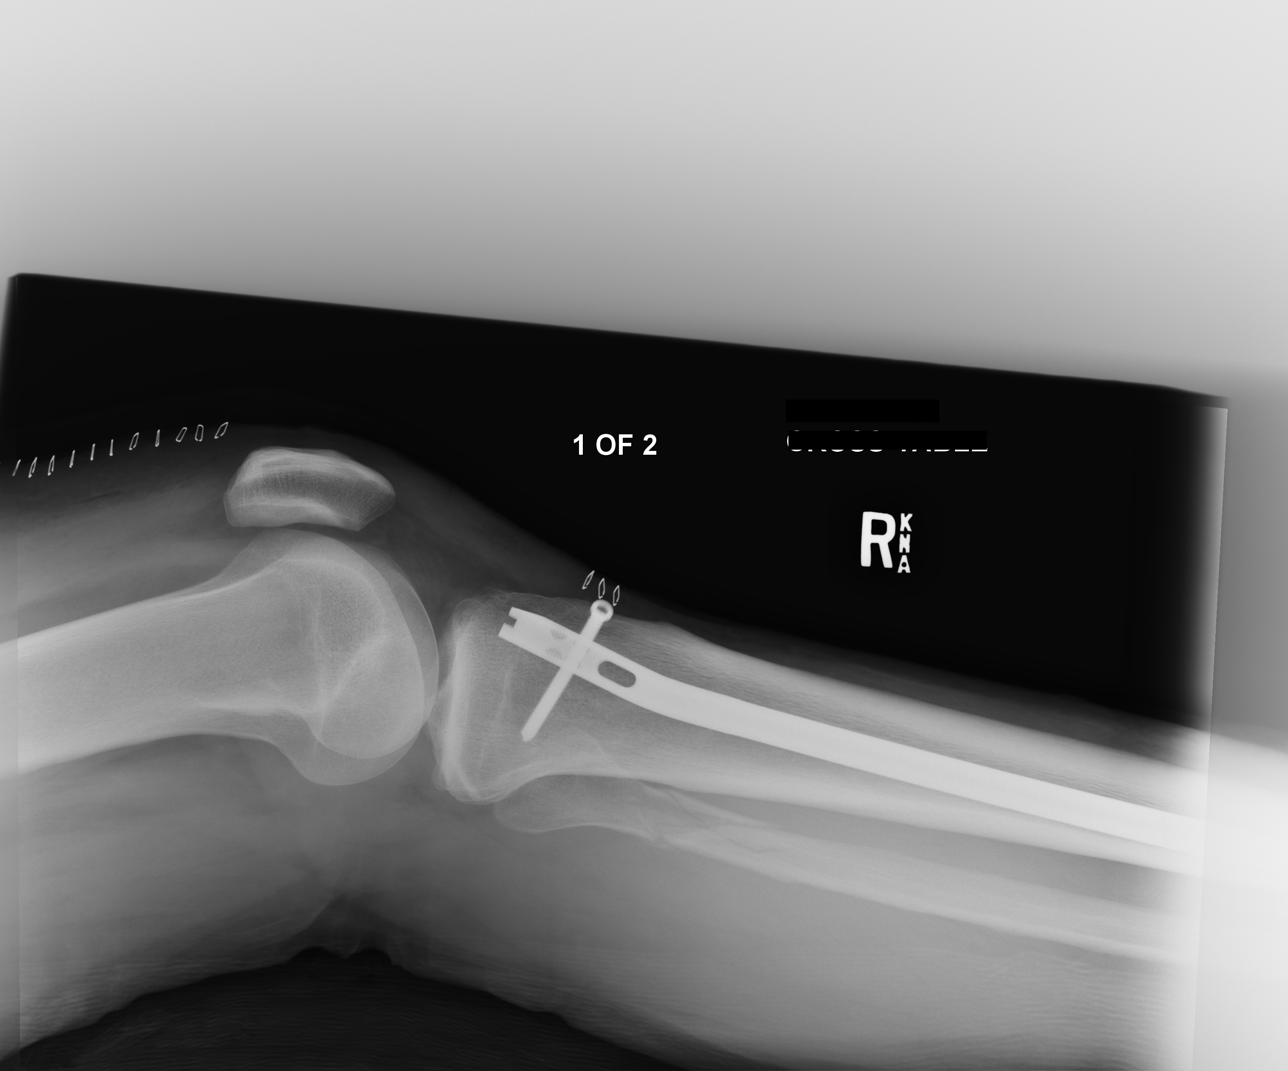

[AP (2 of 2)]
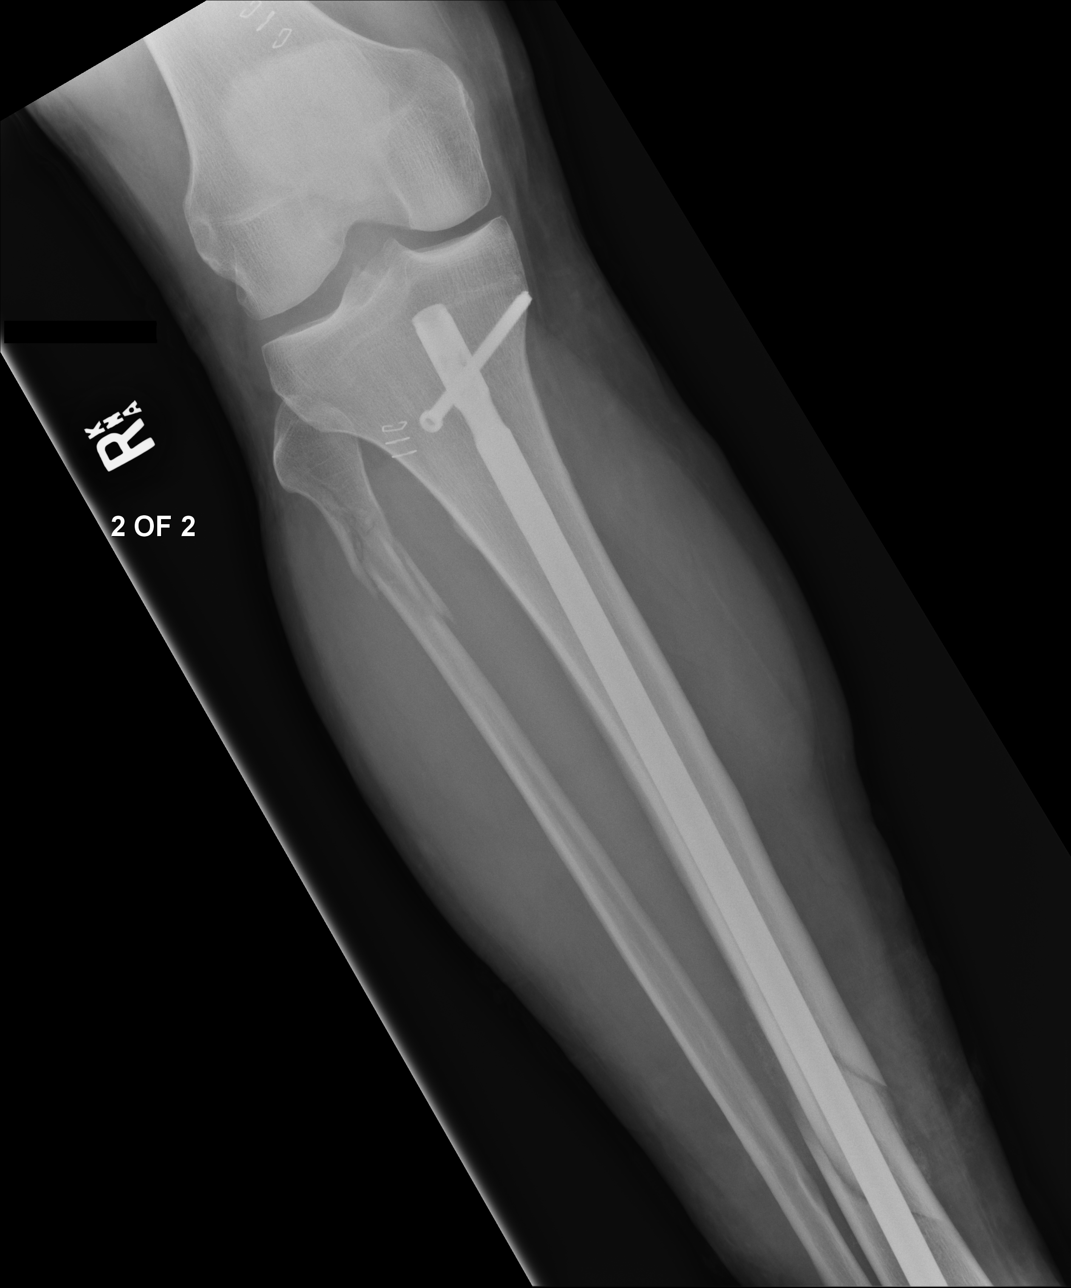

[lateral (2 of 2)]
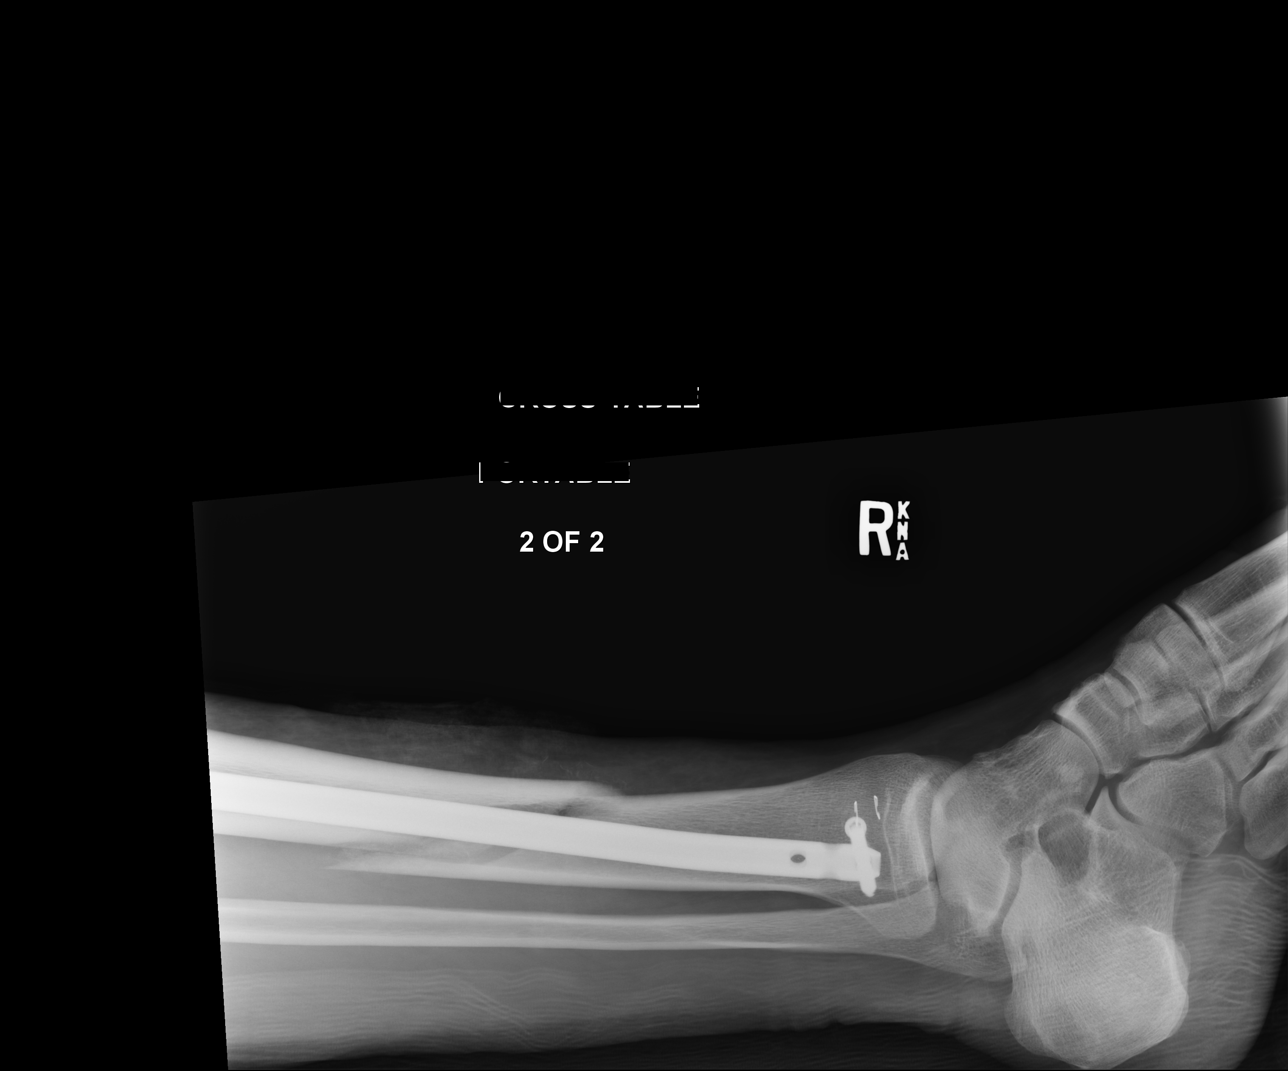

[4 of 4 positions shown; findings below may reference images not displayed]

FINDINGS: An antegrade intramedullary nail has been placed across the
previously described spiral fracture of the distal tibial diaphysis.
There are single proximal and distal interlocking screws. There is
mild residual lateral displacement, improved from the preoperative
examination. There is also slight posterior displacement. Oblique,
mildly comminuted fracture of the proximal fibular shaft has also
been reduced in the interim and is now in near anatomic alignment.
The knee and ankle are grossly located. Skin staples are in place.
IMPRESSION: Interval reduction of tibia and fibula fractures with tibia internal
fixation as above.

## 2018-06-11 DIAGNOSIS — R6883 Chills (without fever): Secondary | ICD-10-CM | POA: Insufficient documentation

## 2018-06-11 DIAGNOSIS — R112 Nausea with vomiting, unspecified: Secondary | ICD-10-CM | POA: Insufficient documentation

## 2018-06-11 DIAGNOSIS — R509 Fever, unspecified: Secondary | ICD-10-CM | POA: Insufficient documentation

## 2018-06-13 ENCOUNTER — Other Ambulatory Visit: Payer: Self-pay

## 2018-06-13 ENCOUNTER — Encounter (HOSPITAL_COMMUNITY): Payer: Self-pay | Admitting: Emergency Medicine

## 2018-06-13 ENCOUNTER — Emergency Department (HOSPITAL_COMMUNITY)
Admission: EM | Admit: 2018-06-13 | Discharge: 2018-06-13 | Disposition: A | Discharge status: Home / Self Care | Payer: BLUE CROSS/BLUE SHIELD | Attending: Emergency Medicine | Admitting: Emergency Medicine

## 2018-06-13 ENCOUNTER — Emergency Department (HOSPITAL_COMMUNITY): Payer: BLUE CROSS/BLUE SHIELD

## 2018-06-13 DIAGNOSIS — R945 Abnormal results of liver function studies: Secondary | ICD-10-CM | POA: Diagnosis not present

## 2018-06-13 DIAGNOSIS — Z87891 Personal history of nicotine dependence: Secondary | ICD-10-CM | POA: Insufficient documentation

## 2018-06-13 DIAGNOSIS — R748 Abnormal levels of other serum enzymes: Secondary | ICD-10-CM

## 2018-06-13 DIAGNOSIS — Z79899 Other long term (current) drug therapy: Secondary | ICD-10-CM | POA: Diagnosis not present

## 2018-06-13 DIAGNOSIS — J189 Pneumonia, unspecified organism: Secondary | ICD-10-CM

## 2018-06-13 DIAGNOSIS — J181 Lobar pneumonia, unspecified organism: Secondary | ICD-10-CM | POA: Diagnosis not present

## 2018-06-13 DIAGNOSIS — R52 Pain, unspecified: Secondary | ICD-10-CM | POA: Diagnosis present

## 2018-06-13 LAB — URINALYSIS, ROUTINE W REFLEX MICROSCOPIC
BILIRUBIN URINE: NEGATIVE
GLUCOSE, UA: NEGATIVE mg/dL
Ketones, ur: NEGATIVE mg/dL
LEUKOCYTES UA: NEGATIVE
Nitrite: NEGATIVE
Protein, ur: NEGATIVE mg/dL
SPECIFIC GRAVITY, URINE: 1.01 (ref 1.005–1.030)
pH: 6 (ref 5.0–8.0)

## 2018-06-13 LAB — COMPREHENSIVE METABOLIC PANEL
ALBUMIN: 3.7 g/dL (ref 3.5–5.0)
ALT: 58 U/L — ABNORMAL HIGH (ref 0–44)
AST: 60 U/L — ABNORMAL HIGH (ref 15–41)
Alkaline Phosphatase: 53 U/L (ref 38–126)
Anion gap: 7 (ref 5–15)
BUN: 14 mg/dL (ref 6–20)
CHLORIDE: 94 mmol/L — AB (ref 98–111)
CO2: 29 mmol/L (ref 22–32)
CREATININE: 1.18 mg/dL (ref 0.61–1.24)
Calcium: 8.4 mg/dL — ABNORMAL LOW (ref 8.9–10.3)
Glucose, Bld: 115 mg/dL — ABNORMAL HIGH (ref 70–99)
POTASSIUM: 3.5 mmol/L (ref 3.5–5.1)
SODIUM: 130 mmol/L — AB (ref 135–145)
Total Bilirubin: 1.7 mg/dL — ABNORMAL HIGH (ref 0.3–1.2)
Total Protein: 7.3 g/dL (ref 6.5–8.1)

## 2018-06-13 LAB — CBC WITH DIFFERENTIAL/PLATELET
Abs Immature Granulocytes: 0.09 10*3/uL — ABNORMAL HIGH (ref 0.00–0.07)
BASOS ABS: 0.1 10*3/uL (ref 0.0–0.1)
Basophils Relative: 1 %
EOS ABS: 0.1 10*3/uL (ref 0.0–0.5)
Eosinophils Relative: 1 %
HCT: 44.6 % (ref 39.0–52.0)
Hemoglobin: 15.1 g/dL (ref 13.0–17.0)
IMMATURE GRANULOCYTES: 1 %
Lymphocytes Relative: 12 %
Lymphs Abs: 1.6 10*3/uL (ref 0.7–4.0)
MCH: 31.7 pg (ref 26.0–34.0)
MCHC: 33.9 g/dL (ref 30.0–36.0)
MCV: 93.7 fL (ref 80.0–100.0)
Monocytes Absolute: 1.9 10*3/uL — ABNORMAL HIGH (ref 0.1–1.0)
Monocytes Relative: 14 %
NEUTROS PCT: 71 %
NRBC: 0 % (ref 0.0–0.2)
Neutro Abs: 9.2 10*3/uL — ABNORMAL HIGH (ref 1.7–7.7)
PLATELETS: 240 10*3/uL (ref 150–400)
RBC: 4.76 MIL/uL (ref 4.22–5.81)
RDW: 12 % (ref 11.5–15.5)
WBC: 12.9 10*3/uL — ABNORMAL HIGH (ref 4.0–10.5)

## 2018-06-13 LAB — LIPASE, BLOOD: LIPASE: 24 U/L (ref 11–51)

## 2018-06-13 MED ORDER — KETOROLAC TROMETHAMINE 30 MG/ML IJ SOLN
30.0000 mg | Freq: Once | INTRAMUSCULAR | Status: AC
  Administered 2018-06-13: 30 mg via INTRAVENOUS
  Filled 2018-06-13: qty 1

## 2018-06-13 MED ORDER — SODIUM CHLORIDE 0.9 % IV BOLUS: 1000.0000 mL | Freq: Once | INTRAVENOUS | Status: DC

## 2018-06-13 MED ORDER — SODIUM CHLORIDE 0.9 % IV SOLN
1.0000 g | Freq: Once | INTRAVENOUS | Status: AC
  Administered 2018-06-13: 1 g via INTRAVENOUS
  Filled 2018-06-13: qty 10

## 2018-06-13 MED ORDER — SODIUM CHLORIDE 0.9 % IV BOLUS
1000.0000 mL | Freq: Once | INTRAVENOUS | Status: AC
  Administered 2018-06-13: 1000 mL via INTRAVENOUS

## 2018-06-13 MED ORDER — SODIUM CHLORIDE 0.9 % IV SOLN
500.0000 mg | INTRAVENOUS | Status: DC
  Administered 2018-06-13: 500 mg via INTRAVENOUS
  Filled 2018-06-13: qty 500

## 2018-06-13 MED ORDER — AZITHROMYCIN 250 MG PO TABS: 250.0000 mg | ORAL_TABLET | Freq: Every day | ORAL | 0 refills | Status: DC

## 2018-06-13 MED ORDER — FENTANYL CITRATE (PF) 100 MCG/2ML IJ SOLN
50.0000 ug | Freq: Once | INTRAMUSCULAR | Status: AC
  Administered 2018-06-13: 50 ug via INTRAVENOUS
  Filled 2018-06-13: qty 2

## 2018-06-13 MED ORDER — ONDANSETRON HCL 4 MG/2ML IJ SOLN
4.0000 mg | Freq: Once | INTRAMUSCULAR | Status: AC
  Administered 2018-06-13: 4 mg via INTRAVENOUS
  Filled 2018-06-13: qty 2

## 2018-06-13 MED ORDER — MORPHINE SULFATE (PF) 4 MG/ML IV SOLN
4.0000 mg | Freq: Once | INTRAVENOUS | Status: AC
  Administered 2018-06-13: 4 mg via INTRAVENOUS
  Filled 2018-06-13: qty 1

## 2018-06-13 NOTE — ED Notes (Signed)
Informed patient in need of urine. Verbalizes understanding.

## 2018-06-13 NOTE — ED Notes (Signed)
Patient to xray.

## 2018-06-13 NOTE — ED Provider Notes (Addendum)
[facility] EMERGENCY DEPARTMENT Provider Note   CSN: [id number] Arrival date & time: 06/13/18  4098     History   Chief Complaint Chief Complaint  Patient presents with  . Generalized Body Aches    HPI Daniel Potter is a 37 y.o. male.  Generalized malaise for 6 days with associated body achiness, fever, chills, nausea, vomiting, left frontal headache, cough, leg cramps.  No rusty sputum or meningeal signs.  He is normally healthy.  He has been able to drink fluids.  Severity of symptoms is moderate.  Nothing makes symptoms better or worse.     History reviewed. No pertinent past medical history.  Patient Active Problem List   Diagnosis Date Noted  . Pain in right foot 10/07/2016  . Closed tibia fracture 09/21/2016  . Closed displaced oblique fracture of shaft of right tibia 09/21/2016  . Closed fracture of right distal tibia     Past Surgical History:  Procedure Laterality Date  . TIBIA IM NAIL INSERTION Right 09/21/2016   Procedure: INTRAMEDULLARY (IM) NAIL TIBIAL;  Surgeon: Tarry Kos, MD;  Location: [facility] OR;  Service: Orthopedics;  Laterality: Right;        Home Medications    Prior to Admission medications   Medication Sig Start Date End Date Taking? Authorizing Provider  ketorolac (TORADOL) 10 MG tablet Take 1 tablet by mouth every 6 (six) hours as needed. 06/11/18  Yes [provider]  aspirin EC 325 MG tablet Take 1 tablet (325 mg total) by mouth 2 (two) times daily. Patient not taking: Reported on 11/04/2016 09/21/16   Tarry Kos, MD  azithromycin (ZITHROMAX) 250 MG tablet Take 1 tablet (250 mg total) by mouth daily. Take first 2 tablets together, then 1 every day until finished. 06/13/18   Donnetta Hutching, MD  citalopram (CELEXA) 40 MG tablet Take 40 mg by mouth daily. 04/21/16   [provider]  magic mouthwash SOLN Daily prn Patient not taking: Reported on 10/07/2016 09/25/16   Tarry Kos, MD  methocarbamol (ROBAXIN) 750 MG tablet TAKE 1  TABLET TWICE A DAY AS NEEDED FOR MUSCLE SPASM 10/28/16   Tarry Kos, MD  nystatin (MYCOSTATIN) 100000 UNIT/ML suspension 400,000-600,000 units qid for thrush Patient not taking: Reported on 10/07/2016 09/25/16   Tarry Kos, MD  ondansetron (ZOFRAN) 4 MG tablet Take 1-2 tablets (4-8 mg total) by mouth every 8 (eight) hours as needed for nausea or vomiting. Patient not taking: Reported on 10/07/2016 09/21/16   Tarry Kos, MD  oxyCODONE (OXY IR/ROXICODONE) 5 MG immediate release tablet Take 1-3 tablets (5-15 mg total) by mouth every 4 (four) hours as needed. Patient not taking: Reported on 11/04/2016 09/21/16   Tarry Kos, MD  oxyCODONE (OXYCONTIN) 10 mg 12 hr tablet Take 1 tablet (10 mg total) by mouth every 12 (twelve) hours. Patient not taking: Reported on 11/04/2016 09/21/16   Tarry Kos, MD  promethazine (PHENERGAN) 25 MG tablet Take 1 tablet (25 mg total) by mouth every 6 (six) hours as needed for nausea. Patient not taking: Reported on 10/07/2016 09/21/16   Tarry Kos, MD  senna-docusate (SENOKOT S) 8.6-50 MG tablet Take 1 tablet by mouth at bedtime as needed. Patient not taking: Reported on 10/07/2016 09/21/16   Tarry Kos, MD  varenicline (CHANTIX) 1 MG tablet Take 1 tablet by mouth 2 (two) times daily. 10/31/15   [provider]    Family History No family history on file.  Social History  Social History   Tobacco Use  . Smoking status: Former Smoker    Types: Cigarettes  . Smokeless tobacco: Never Used  Substance Use Topics  . Alcohol use: Yes    Alcohol/week: 12.0 standard drinks    Types: 12 Cans of beer per week  . Drug use: No     Allergies   Patient has no known allergies.   Review of Systems Review of Systems  All other systems reviewed and are negative.    Physical Exam Updated Vital Signs BP 126/74   Pulse 87   Temp 98.4 F (36.9 C)   Resp 18   Ht 6\' 5"  (1.956 m)   Wt 134.7 kg   SpO2 95%   BMI 35.22 kg/m   Physical Exam    Constitutional: He is oriented to person, place, and time. He appears well-developed and well-nourished.  Alert, pleasant, no acute distress.  HENT:  Head: Normocephalic and atraumatic.  Eyes: Conjunctivae are normal.  Neck: Neck supple.  Cardiovascular: Normal rate and regular rhythm.  Pulmonary/Chest: Effort normal and breath sounds normal.  Abdominal: Soft. Bowel sounds are normal.  Musculoskeletal: Normal range of motion.  Neurological: He is alert and oriented to person, place, and time.  Skin: Skin is warm and dry.  Psychiatric: He has a normal mood and affect. His behavior is normal.  Nursing note and vitals reviewed.    ED Treatments / Results  Labs (all labs ordered are listed, but only abnormal results are displayed) Labs Reviewed  CBC WITH DIFFERENTIAL/PLATELET - Abnormal; Notable for the following components:      Result Value   WBC 12.9 (*)    Neutro Abs 9.2 (*)    Monocytes Absolute 1.9 (*)    Abs Immature Granulocytes 0.09 (*)    All other components within normal limits  COMPREHENSIVE METABOLIC PANEL - Abnormal; Notable for the following components:   Sodium 130 (*)    Chloride 94 (*)    Glucose, Bld 115 (*)    Calcium 8.4 (*)    AST 60 (*)    ALT 58 (*)    Total Bilirubin 1.7 (*)    All other components within normal limits  URINALYSIS, ROUTINE W REFLEX MICROSCOPIC - Abnormal; Notable for the following components:   Hgb urine dipstick MODERATE (*)    Bacteria, UA RARE (*)    All other components within normal limits  LIPASE, BLOOD    EKG None  Radiology Dg Chest 2 View  Result Date: 06/13/2018 CLINICAL DATA:  Cough and fever for several days EXAM: CHEST - 2 VIEW COMPARISON:  None. FINDINGS: Cardiac shadow is within normal limits. Left lung is clear. The right lung demonstrates patchy right upper and more dense right middle lobe infiltrate. No sizable effusion is noted. No bony abnormality is seen. IMPRESSION: Right middle and right upper lobe  pneumonia. Electronically Signed   By: Alcide Clever M.D.   On: 06/13/2018 08:35    Procedures Procedures (including critical care time)  Medications Ordered in ED Medications  azithromycin (ZITHROMAX) 500 mg in sodium chloride 0.9 % 250 mL IVPB (0 mg Intravenous Stopped 06/13/18 1143)  sodium chloride 0.9 % bolus 1,000 mL (1,000 mLs Intravenous Not Given 06/13/18 1019)  ondansetron (ZOFRAN) injection 4 mg (4 mg Intravenous Given 06/13/18 0755)  sodium chloride 0.9 % bolus 1,000 mL (0 mLs Intravenous Stopped 06/13/18 0900)  fentaNYL (SUBLIMAZE) injection 50 mcg (50 mcg Intravenous Given 06/13/18 0755)  sodium chloride 0.9 % bolus 1,000 mL (  0 mLs Intravenous Stopped 06/13/18 0900)  cefTRIAXone (ROCEPHIN) 1 g in sodium chloride 0.9 % 100 mL IVPB (0 g Intravenous Stopped 06/13/18 1025)  morphine 4 MG/ML injection 4 mg (4 mg Intravenous Given 06/13/18 0935)  ketorolac (TORADOL) 30 MG/ML injection 30 mg (30 mg Intravenous Given 06/13/18 0935)  sodium chloride 0.9 % bolus 1,000 mL (0 mLs Intravenous Stopped 06/13/18 1143)     Initial Impression / Assessment and Plan / ED Course  I have reviewed the triage vital signs and the nursing notes.  Pertinent labs & imaging results that were available during my care of the patient were reviewed by me and considered in my medical decision making (see chart for details).     Patient presents with a constellation of symptoms well documented in HPI.  White count 12.9.  Liver functions minimally elevated.  Chest x-ray reveals right upper lobe and right middle lobe pneumonia.  Patient was given 3 L of IV fluids, IV Zithromax, IV Rocephin, IV analgesics..  He was observed for several hours.  His vital signs remained stable.  Discharge medication Zithromax orally.  He will take Toradol at home.  Discussed elevated liver function.   CRITICAL CARE Performed by: Donnetta Hutching Total critical care time: 30 minutes Critical care time was exclusive of separately  billable procedures and treating other patients. Critical care was necessary to treat or prevent imminent or life-threatening deterioration. Critical care was time spent personally by me on the following activities: development of treatment plan with patient and/or surrogate as well as nursing, discussions with consultants, evaluation of patient's response to treatment, examination of patient, obtaining history from patient or surrogate, ordering and performing treatments and interventions, ordering and review of laboratory studies, ordering and review of radiographic studies, pulse oximetry and re-evaluation of patient's condition.  Final Clinical Impressions(s) / ED Diagnoses   Final diagnoses:  Community acquired pneumonia of right upper lobe of lung (HCC)  Community acquired pneumonia of right middle lobe of lung (HCC)  Elevated liver enzymes    ED Discharge Orders         Ordered    azithromycin (ZITHROMAX) 250 MG tablet  Daily     06/13/18 1127           Donnetta Hutching, MD 06/13/18 1149    Donnetta Hutching, MD 06/13/18 1151

## 2018-06-13 NOTE — Discharge Instructions (Signed)
You have pneumonia in your right middle and right upper lobe of the lung.  Increase fluids.  Oral Toradol and Tylenol for pain and fever.  Rest.

## 2018-06-13 NOTE — ED Triage Notes (Signed)
Pt c/o not feeling well since Monday. C/o intermittent body aches, fever/chills, n/v and severe ha. Pt seen by pcp on Friday and negative for flu and strep. Pt c/o increasing leg cramps.

## 2018-06-13 NOTE — ED Notes (Signed)
ED Provider at bedside. 

## 2018-06-14 NOTE — ED Provider Notes (Signed)
  Physical Exam  BP 126/74   Pulse 87   Temp 98.4 F (36.9 C)   Resp 18   Ht 6\' 5"  (1.956 m)   Wt 134.7 kg   SpO2 95%   BMI 35.22 kg/m   Physical Exam  ED Course/Procedures     Procedures  MDM  Patient's wife called to discuss some results after Dr. Adriana Simas had called them.  Informed of elevated liver enzymes.  We will follow-up with Dr. Andrey Campanile.       Daniel Core, MD 06/14/18 [phone]

## 2018-06-16 NOTE — ED Provider Notes (Signed)
I have tried to call patient several times in regards to his labs.  I was able to contact his wife Turkey today at [phone].  Discussed elevated liver functions.  She will encourage her husband to have them rechecked by his primary care doctor.   Donnetta Hutching, MD 06/16/18 [phone]

## 2020-01-31 ENCOUNTER — Ambulatory Visit: Payer: BC Managed Care – PPO

## 2020-01-31 ENCOUNTER — Encounter: Payer: Self-pay | Admitting: Orthopaedic Surgery

## 2020-01-31 ENCOUNTER — Ambulatory Visit (INDEPENDENT_AMBULATORY_CARE_PROVIDER_SITE_OTHER): Payer: BC Managed Care – PPO | Admitting: Orthopaedic Surgery

## 2020-01-31 ENCOUNTER — Other Ambulatory Visit: Payer: Self-pay

## 2020-01-31 VITALS — BP 138/89 | HR 59 | Ht 77.0 in | Wt 282.5 lb

## 2020-01-31 DIAGNOSIS — M5432 Sciatica, left side: Secondary | ICD-10-CM

## 2020-01-31 MED ORDER — HYDROCODONE-ACETAMINOPHEN 7.5-325 MG PO TABS: 1.0000 | ORAL_TABLET | ORAL | 0 refills | Status: AC | PRN

## 2020-01-31 MED ORDER — PREDNISONE 10 MG (21) PO TBPK: ORAL_TABLET | ORAL | 1 refills | Status: DC

## 2020-01-31 MED ORDER — CYCLOBENZAPRINE HCL 10 MG PO TABS: ORAL_TABLET | ORAL | 3 refills | Status: AC

## 2020-01-31 NOTE — Progress Notes (Signed)
Subjective:    Patient ID: Daniel Potter, male    DOB: [birth year], 39 y.o.   MRN: [id number]  HPI He was moving about two weeks ago.  He moved a large sofa and then noticed some lower back pain on the left side.  It went away but has come back and is very painful now over the last week.  He has pain running to the left hip and left upper posterior thigh.  He has tightness of the lower back.  He has tried ibuprofen 800 every six hours and heat with little help.  He is awakened at night with back pain.  He has no other trauma.  He has no weakness or numbness.  He has pain when extending his left knee at times.   Review of Systems  Constitutional: Positive for activity change.  Musculoskeletal: Positive for arthralgias, back pain, gait problem and myalgias.  All other systems reviewed and are negative.  For Review of Systems, all other systems reviewed and are negative.  The following is a summary of the past history medically, past history surgically, known current medicines, social history and family history.  This information is gathered electronically by the computer from prior information and documentation.  I review this each visit and have found including this information at this point in the chart is beneficial and informative.   History reviewed. No pertinent past medical history.  Past Surgical History:  Procedure Laterality Date  . TIBIA IM NAIL INSERTION Right 09/21/2016   Procedure: INTRAMEDULLARY (IM) NAIL TIBIAL;  Surgeon: Tarry Kos, MD;  Location: [facility] OR;  Service: Orthopedics;  Laterality: Right;    Current Outpatient Medications on File Prior to Visit  Medication Sig Dispense Refill  . citalopram (CELEXA) 40 MG tablet Take 40 mg by mouth daily.    Marland Kitchen aspirin EC 325 MG tablet Take 1 tablet (325 mg total) by mouth 2 (two) times daily. (Patient not taking: Reported on 11/04/2016) 84 tablet 0  . azithromycin (ZITHROMAX) 250 MG tablet Take 1 tablet (250 mg total) by mouth  daily. Take first 2 tablets together, then 1 every day until finished. (Patient not taking: Reported on 01/31/2020) 6 tablet 0  . ketorolac (TORADOL) 10 MG tablet Take 1 tablet by mouth every 6 (six) hours as needed.    . magic mouthwash SOLN Daily prn (Patient not taking: Reported on 10/07/2016) 150 mL 0  . methocarbamol (ROBAXIN) 750 MG tablet TAKE 1 TABLET TWICE A DAY AS NEEDED FOR MUSCLE SPASM (Patient not taking: Reported on 01/31/2020) 60 tablet 0  . nystatin (MYCOSTATIN) 100000 UNIT/ML suspension 400,000-600,000 units qid for thrush (Patient not taking: Reported on 10/07/2016) 60 mL 0  . ondansetron (ZOFRAN) 4 MG tablet Take 1-2 tablets (4-8 mg total) by mouth every 8 (eight) hours as needed for nausea or vomiting. (Patient not taking: Reported on 10/07/2016) 40 tablet 0  . oxyCODONE (OXY IR/ROXICODONE) 5 MG immediate release tablet Take 1-3 tablets (5-15 mg total) by mouth every 4 (four) hours as needed. (Patient not taking: Reported on 11/04/2016) 90 tablet 0  . oxyCODONE (OXYCONTIN) 10 mg 12 hr tablet Take 1 tablet (10 mg total) by mouth every 12 (twelve) hours. (Patient not taking: Reported on 11/04/2016) 10 tablet 0  . promethazine (PHENERGAN) 25 MG tablet Take 1 tablet (25 mg total) by mouth every 6 (six) hours as needed for nausea. (Patient not taking: Reported on 10/07/2016) 30 tablet 1  . senna-docusate (SENOKOT S) 8.6-50 MG tablet Take 1  tablet by mouth at bedtime as needed. (Patient not taking: Reported on 10/07/2016) 30 tablet 1  . varenicline (CHANTIX) 1 MG tablet Take 1 tablet by mouth 2 (two) times daily.     No current facility-administered medications on file prior to visit.    Social History   Socioeconomic History  . Marital status: Unknown    Spouse name: Not on file  . Number of children: Not on file  . Years of education: Not on file  . Highest education level: Not on file  Occupational History  . Not on file  Tobacco Use  . Smoking status: Former Smoker    Types: Cigarettes  .  Smokeless tobacco: Never Used  Substance and Sexual Activity  . Alcohol use: Yes    Alcohol/week: 12.0 standard drinks    Types: 12 Cans of beer per week  . Drug use: No  . Sexual activity: Not on file  Other Topics Concern  . Not on file  Social History Narrative  . Not on file   Social Determinants of Health   Financial Resource Strain:   . Difficulty of Paying Living Expenses:   Food Insecurity:   . Worried About Programme researcher, broadcasting/film/video in the Last Year:   . Barista in the Last Year:   Transportation Needs:   . Freight forwarder (Medical):   Marland Kitchen Lack of Transportation (Non-Medical):   Physical Activity:   . Days of Exercise per Week:   . Minutes of Exercise per Session:   Stress:   . Feeling of Stress :   Social Connections:   . Frequency of Communication with Friends and Family:   . Frequency of Social Gatherings with Friends and Family:   . Attends Religious Services:   . Active Member of Clubs or Organizations:   . Attends Banker Meetings:   Marland Kitchen Marital Status:   Intimate Partner Violence:   . Fear of Current or Ex-Partner:   . Emotionally Abused:   Marland Kitchen Physically Abused:   . Sexually Abused:     Family History  Problem Relation Age of Onset  . Diabetes Father   . Cancer Maternal Grandfather   . Diabetes Paternal Grandmother   . Cancer Paternal Grandfather     BP 138/89   Pulse (!) 59   Ht 6\' 5"  (1.956 m)   Wt 282 lb 8 oz (128.1 kg)   BMI 33.50 kg/m   Body mass index is 33.5 kg/m.     Objective:   Physical Exam Vitals and nursing note reviewed.  Constitutional:      Appearance: He is well-developed.  HENT:     Head: Normocephalic and atraumatic.  Eyes:     Conjunctiva/sclera: Conjunctivae normal.     Pupils: Pupils are equal, round, and reactive to light.  Cardiovascular:     Rate and Rhythm: Normal rate and regular rhythm.  Pulmonary:     Effort: Pulmonary effort is normal.  Abdominal:     Palpations: Abdomen is soft.    Musculoskeletal:       Arms:     Cervical back: Normal range of motion and neck supple.  Skin:    General: Skin is warm and dry.  Neurological:     Mental Status: He is alert and oriented to person, place, and time.     Cranial Nerves: No cranial nerve deficit.     Motor: No abnormal muscle tone.     Coordination: Coordination normal.  Deep Tendon Reflexes: Reflexes are normal and symmetric. Reflexes normal.  Psychiatric:        Behavior: Behavior normal.        Thought Content: Thought content normal.        Judgment: Judgment normal.     X-rays were done of the lumbar spine, reported separately.      Assessment & Plan:   Encounter Diagnosis  Name Primary?  . Back pain with left-sided sciatica Yes   I will begin Prednisone, hydrocodone and Flexeril.  Return in one week.  He may need MRI.    Use ice to lower back.  Call if any problem.  Precautions discussed.  I could not access state narcotic site.  Computer timed out.   Electronically [place], MD 6/1/20212:59 PM

## 2020-02-07 ENCOUNTER — Ambulatory Visit (INDEPENDENT_AMBULATORY_CARE_PROVIDER_SITE_OTHER): Payer: BC Managed Care – PPO | Admitting: Orthopaedic Surgery

## 2020-02-07 ENCOUNTER — Other Ambulatory Visit: Payer: Self-pay

## 2020-02-07 ENCOUNTER — Encounter: Payer: Self-pay | Admitting: Orthopaedic Surgery

## 2020-02-07 VITALS — BP 130/83 | HR 64 | Ht 77.0 in | Wt 280.0 lb

## 2020-02-07 DIAGNOSIS — M5432 Sciatica, left side: Secondary | ICD-10-CM | POA: Diagnosis not present

## 2020-02-07 MED ORDER — HYDROCODONE-ACETAMINOPHEN 5-325 MG PO TABS: ORAL_TABLET | ORAL | 0 refills | Status: AC

## 2020-02-07 MED ORDER — PREDNISONE 10 MG PO TABS: ORAL_TABLET | ORAL | 0 refills | Status: AC

## 2020-02-07 NOTE — Patient Instructions (Signed)
Use Aspercreme, Biofreeze or Voltaren gel over the counter 2-3 times daily make sure you rub it in well each time you use it.   

## 2020-02-07 NOTE — Progress Notes (Signed)
Patient IR:CVELF [name], male DOB:27-Dec-1980, 39 y.o. YBO:[id number]  Chief Complaint  Patient presents with  . Back Pain    HPI  Daniel Potter is a 39 y.o. male who has lower back pain that got better for a few days with the prednisone dose pack but is worse now.  He has pain running down the left posterior thigh to the foot.  He took the muscle relaxant at night and it helped.  He has some slight weakness on the left.  He is still working.  His family is planning a vacation leaving this coming Saturday June 12 thru June 20th.    I would like to get a MRI of the lumbar spine as I am concerned about a HNP.  He has positive SLR on the left, slight decrease in Reflexes to the ankle, slight weakness on the left.    I will give prednisone and renew his pain medicine.  Return on two weeks.  He will be out of town next week.   Body mass index is 33.2 kg/m.  ROS  Review of Systems  Constitutional: Positive for activity change.  Musculoskeletal: Positive for arthralgias, back pain, gait problem and myalgias.  All other systems reviewed and are negative.   All other systems reviewed and are negative.  The following is a summary of the past history medically, past history surgically, known current medicines, social history and family history.  This information is gathered electronically by the computer from prior information and documentation.  I review this each visit and have found including this information at this point in the chart is beneficial and informative.    History reviewed. No pertinent past medical history.  Past Surgical History:  Procedure Laterality Date  . TIBIA IM NAIL INSERTION Right 09/21/2016   Procedure: INTRAMEDULLARY (IM) NAIL TIBIAL;  Surgeon: Leandrew Koyanagi, MD;  Location: [place];  Service: Orthopedics;  Laterality: Right;    Family History  Problem Relation Age of Onset  . Diabetes Father   . Cancer Maternal Grandfather   . Diabetes Paternal Grandmother    . Cancer Paternal Grandfather     Social History Social History   Tobacco Use  . Smoking status: Former Smoker    Types: Cigarettes  . Smokeless tobacco: Never Used  Substance Use Topics  . Alcohol use: Yes    Alcohol/week: 12.0 standard drinks    Types: 12 Cans of beer per week  . Drug use: No    No Known Allergies  Current Outpatient Medications  Medication Sig Dispense Refill  . citalopram (CELEXA) 40 MG tablet Take 40 mg by mouth daily.    . cyclobenzaprine (FLEXERIL) 10 MG tablet One tablet every eight (8) hours as needed for back spasm. 30 tablet 3  . HYDROcodone-acetaminophen (NORCO/VICODIN) 5-325 MG tablet One tablet every four hours for pain. 30 tablet 0  . predniSONE (DELTASONE) 10 MG tablet Take five pills daily for five days, take four pills daily for five days, take three pills daily for five days, take two pills daily for five days, then one pill a day for five days. 75 tablet 0  . varenicline (CHANTIX) 1 MG tablet Take 1 tablet by mouth 2 (two) times daily.     No current facility-administered medications for this visit.     Physical Exam  Blood pressure 130/83, pulse 64, height 6\' 5"  (1.956 m), weight 280 lb (127 kg).  Constitutional: overall normal hygiene, normal nutrition, well developed, normal grooming, normal body habitus. Assistive  device:none  Musculoskeletal: gait and station Limp left, muscle tone and strength are normal, no tremors or atrophy is present.  .  Neurological: coordination overall normal.  Deep tendon reflex/nerve stretch intact on the right, decreased Achilles tendon reflex on the left, slight weakness on the left lower extremity from the right and positive straight leg raising on the left at 20 degrees.  Sensation normal.  Cranial nerves II-XII intact.   Skin:   Normal overall no scars, lesions, ulcers or rashes. No psoriasis.  Psychiatric: Alert and oriented x 3.  Recent memory intact, remote memory unclear.  Normal mood and  affect. Well groomed.  Good eye contact.  Cardiovascular: overall no swelling, no varicosities, no edema bilaterally, normal temperatures of the legs and arms, no clubbing, cyanosis and good capillary refill.  Lymphatic: palpation is normal.  All other systems reviewed and are negative   The patient has been educated about the nature of the problem(s) and counseled on treatment options.  The patient appeared to understand what I have discussed and is in agreement with it.  Encounter Diagnosis  Name Primary?  . Back pain with left-sided sciatica Yes    PLAN Call if any problems.  Precautions discussed.  Continue current medications. I have changed the prednisone dose.  Return to clinic 2 weeks   Get MRI of the lumbar spine.   I have reviewed the [place] Controlled Substance Reporting System web site prior to prescribing narcotic medicine for this patient.   Electronically Signed Darreld Mclean, MD 6/8/202112:08 PM

## 2020-02-21 ENCOUNTER — Ambulatory Visit: Payer: BC Managed Care – PPO | Admitting: Orthopaedic Surgery

## 2020-02-23 ENCOUNTER — Encounter: Payer: Self-pay | Admitting: Radiology

## 2020-02-26 ENCOUNTER — Other Ambulatory Visit: Payer: Self-pay | Admitting: Orthopaedic Surgery

## 2020-03-01 MED ORDER — NAPROXEN 500 MG PO TABS: 500.0000 mg | ORAL_TABLET | Freq: Two times a day (BID) | ORAL | 5 refills | Status: AC

## 2023-12-01 DIAGNOSIS — R6882 Decreased libido: Secondary | ICD-10-CM | POA: Diagnosis not present

## 2023-12-01 DIAGNOSIS — Z13220 Encounter for screening for lipoid disorders: Secondary | ICD-10-CM | POA: Diagnosis not present

## 2023-12-01 DIAGNOSIS — Z0000 Encounter for general adult medical examination without abnormal findings: Secondary | ICD-10-CM | POA: Diagnosis not present

## 2023-12-01 DIAGNOSIS — Z125 Encounter for screening for malignant neoplasm of prostate: Secondary | ICD-10-CM | POA: Diagnosis not present

## 2023-12-01 DIAGNOSIS — F411 Generalized anxiety disorder: Secondary | ICD-10-CM | POA: Diagnosis not present

## 2023-12-01 DIAGNOSIS — Z1329 Encounter for screening for other suspected endocrine disorder: Secondary | ICD-10-CM | POA: Diagnosis not present
